# Patient Record
Sex: Female | Born: 1985 | Race: White | Hispanic: No | Marital: Single | Smoking: Never smoker
Health system: Southern US, Community
[De-identification: ages and names within clinical notes are randomized; demographics above are authoritative.]

## PROBLEM LIST (undated history)

## (undated) DIAGNOSIS — N200 Calculus of kidney: Secondary | ICD-10-CM

## (undated) DIAGNOSIS — T8859XA Other complications of anesthesia, initial encounter: Secondary | ICD-10-CM

## (undated) DIAGNOSIS — F39 Unspecified mood [affective] disorder: Secondary | ICD-10-CM

## (undated) DIAGNOSIS — F419 Anxiety disorder, unspecified: Secondary | ICD-10-CM

## (undated) HISTORY — PX: LITHOTRIPSY: SUR834

## (undated) HISTORY — PX: LEEP: SHX91

## (undated) HISTORY — DX: Calculus of kidney: N20.0

## (undated) HISTORY — DX: Unspecified mood (affective) disorder: F39

## (undated) HISTORY — DX: Anxiety disorder, unspecified: F41.9

## (undated) HISTORY — DX: Other complications of anesthesia, initial encounter: T88.59XA

---

## 2012-05-08 ENCOUNTER — Ambulatory Visit: Payer: Self-pay | Admitting: Dentist

## 2012-05-08 ENCOUNTER — Encounter: Payer: Self-pay | Admitting: Dentist

## 2012-05-08 VITALS — BP 121/64 | HR 67 | Temp 97.3°F | Ht 70.0 in | Wt 190.0 lb

## 2012-05-08 DIAGNOSIS — Z7689 Persons encountering health services in other specified circumstances: Secondary | ICD-10-CM

## 2012-05-09 ENCOUNTER — Encounter: Payer: Self-pay | Admitting: Dentist

## 2012-05-09 NOTE — Progress Notes (Signed)
Consult Requested by: Drs. Colletta [place], Urgent Care  Chief Complaint   Patient presents with   . Dental Problem       History of present Illness: asymptomatic at this time    Past Medical Hx:   Past Medical History   Diagnosis Date   . Kidney stones      Past Surgical Hx:   Past Surgical History   Procedure Laterality Date   . Lithotripsy     . Leep         Complications with General anesthesia/surgery:  None    Meds:   Prior to Admission medications    Medication Sig Start Date End Date Taking? Authorizing Provider   Norgestim-Eth Estrad Triphasic (ORTHO TRI-CYCLEN, 28,) 0.18/0.215/0.25 MG-35 MCG TABS Take 1 tablet by mouth daily   Yes [provider]   OCELLA 3-0.03 MG per tablet Take  by mouth. 04/30/07   Provider, Conversion   ciprofloxacin (CIPRO) 500 MG tablet take 1 tablet by mouth twice a day for 3 days 11/24/06   Provider, Conversion   HYDROcodone-acetaminophen (VICODIN) 5-500 MG per tablet take 1 tablet by mouth every 6 hours if needed 07/26/07   Provider, Conversion   BICARSIM 300-60 MG TABS take 1 capsule by mouth twice a day for 15 DAYS 07/14/07   Provider, Conversion   nicotine (NICODERM CQ) 7 MG/24HR apply 1 patch once daily for 7 days 07/14/07   Provider, Conversion   VALTREX 500 MG tablet take 2 tablets by mouth three times a day for 7 days 07/14/07   Provider, Conversion   DENAVIR 1 % cream apply topically twice a day as directed 07/04/07   Provider, Conversion   EFFEXOR XR 75 MG 24 hr capsule take 1 capsule by mouth once daily 06/20/07   Provider, Conversion      (home meds)  Current Outpatient Prescriptions   Medication   . Norgestim-Eth Estrad Triphasic (ORTHO TRI-CYCLEN, 28,) 0.18/0.215/0.25 MG-35 MCG TABS   . OCELLA 3-0.03 MG per tablet   . ciprofloxacin (CIPRO) 500 MG tablet   . HYDROcodone-acetaminophen (VICODIN) 5-500 MG per tablet   . BICARSIM 300-60 MG TABS   . nicotine (NICODERM CQ) 7 MG/24HR   . VALTREX 500 MG tablet   . DENAVIR 1 % cream   . EFFEXOR XR 75 MG 24 hr capsule     No  current facility-administered medications for this visit.      (current meds)  Allergies:  Review of patient's allergies indicates no known allergies (drug, envir, food or latex).  Social:  reports that she has been smoking Cigarettes.  She has been smoking about 0.25 packs per day. She does not have any smokeless tobacco history on file. She reports that  drinks alcohol. She reports that she does not use illicit drugs.   Family [id number] reviewed. No pertinent family history.    Family h/o complications with General Anesthesia:   None    VS: Blood pressure 121/64, pulse 67, temperature 36.3 C (97.3 F), temperature source Temporal, height 1.778 m (5\' 10" ), weight 86.183 kg (190 lb), last menstrual period 04/30/2012, SpO2 100.00%.    Extraoral exam:    No Cervical LAD   TMJ: No Crepitus    Intraoral exam:   Maximum incisal opening:  40 mm   Mallampati:  Class 2   No mucosal lesions   Third molars:  17 and 32 present:     Radiographs:  Pan in [place]   Recurrent decay: 14, 15, 18,  31   Retained roots: teeth 17, 30 (already was extracted), 32    Assessment: 27 y.o.  female with dental anxiety referred for extraction of teeth 14, 15, 17, 18, 19 (please re-evaluate for extraction), 31, 32 under sedation.      Plan: extraction of teeth 14, 15, 17, 18, 19 (please re-evaluate for extraction), 31, 32 with IV sedation    Schedule for 90 min    Reviewed with Dr. Lurlean Leyden ([facility] Resident)

## 2012-05-30 ENCOUNTER — Ambulatory Visit: Payer: Self-pay | Admitting: Dentist

## 2012-05-30 ENCOUNTER — Encounter: Payer: Self-pay | Admitting: Dentist

## 2012-05-30 VITALS — BP 127/87 | HR 82 | Resp 18 | Ht 69.0 in | Wt 190.0 lb

## 2012-05-30 DIAGNOSIS — K029 Dental caries, unspecified: Secondary | ICD-10-CM

## 2012-05-30 MED ORDER — SODIUM CHLORIDE 0.9 % IV SOLN WRAPPED *I*
125.0000 mL/h | Status: AC
Start: 2012-05-30 — End: 2012-07-29
  Administered 2012-05-30: 125 mL/h via INTRAVENOUS

## 2012-05-30 MED ORDER — IBUPROFEN 600 MG PO TABS *I*
600.0000 mg | ORAL_TABLET | Freq: Four times a day (QID) | ORAL | Status: AC | PRN
Start: 2012-05-30 — End: 2012-06-29

## 2012-05-30 MED ORDER — FENTANYL CITRATE 50 MCG/ML IJ SOLN *WRAPPED*
INTRAMUSCULAR | Status: DC
Start: 2012-05-30 — End: 2012-05-30
  Filled 2012-05-30: qty 2

## 2012-05-30 MED ORDER — MIDAZOLAM HCL 5 MG/5ML IJ SOLN *I*
INTRAMUSCULAR | Status: DC
Start: 2012-05-30 — End: 2012-05-30
  Filled 2012-05-30: qty 10

## 2012-05-30 MED ORDER — CHLORHEXIDINE GLUCONATE 0.12 % MT SOLN *I*
15.0000 mL | Freq: Two times a day (BID) | OROMUCOSAL | Status: DC
Start: 2012-05-30 — End: 2015-01-01

## 2012-05-30 MED ORDER — HYDROCODONE-ACETAMINOPHEN 5-325 MG PO TABS *I*
1.0000 | ORAL_TABLET | ORAL | Status: DC | PRN
Start: 2012-05-30 — End: 2015-01-01

## 2012-05-30 NOTE — Patient Instructions (Signed)
ORAL & MAXILLOFACIAL SURGERY    POSTOPERATIVE INSTRUCTIONS FOLLOWING DENTAL SURGERY AND SEDATION    After receiving sedative medications you may be drowsy for up to 24hrs.  DO NOT drive or operate heavy machinery for 24hrs, DO NOT drink alcoholic beverages for 24hrs, DO NOT make major decisions, sign contracts, etc. for 24hrs.    DO NOT drive, operate heavy machinery, drink alcoholic beverages or make major decisions or sign important documents while taking narcotic pain medications.     First 30 minutes after procedure:  Bite on gauze for 30 minutes after your procedure, to help stop bleeding.  It is best that you do this without checking the site until the 30 minutes is up. Do not chew on the gauze, as continued pressure is desired.  During this time, do not speak or do other activities that could dislodge the gauze packs.  If bleeding continues after 30 minutes, bite on fresh gauze as follows:  dampen the gauze first so that it is moist (not wet) and fold it twice.  Place the dampened gauze on the extraction sites for another 30 minutes.  Repeat as necessary.  A small amount of continued blood, which tinges the saliva, is expected during the first 24 hours and does not require further pressure.  If bleeding restarts after it has stopped, use gauze pressure again or consider gently biting on a dry tea bag placed over the extraction site for 30 minutes.  If the bleeding does not stop despite repeated attempts as above, please call [phone].    Avoid excessive exertion for 72 hours after your procedure.  No bending, lifting, strenuous activity,sports or gym-class.  After this time, resume normal activity gradually as you become comfortable.    Mouth opening may be limited during the first week to ten days after surgery.  This is expected due to swelling and tightness of the jaw muscles.   Bruising (black and blue areas) may develop after surgery.  Should this occur, expect bruising to resolve after the first week to  ten days.   5.   Earache or TMJ (jaw joint) tenderness may develop that will gradually resolve over the first week.          6.   NO ORAL CARE during the first 24 hours.  Beginning on the day after surgery, rinse your mouth after meals (or    4-5 times per day) with warm salt water (1/2 teaspoon of salt in an 8oz. glass of warm water).  A prescription mouth rinse may be given in selected cases. Begin tooth brushing on the day after surgery, as you normally  would however this should be performed gently in the area of extractions.         7.  DO NOT USE STRAWS for the first week after surgery.  Straws create suction pressure inside your mouth and could dislodge the blood clots that normally form in the extraction sites.          8.  DO NOT SMOKE during the first 21 days after surgery.  Smoking greatly increases the risk of  and other healing complications including infection, pain, and dry socket.          9.  SWELLING is normal and will maximize 48 - 72 hours after surgery.  Apply ice in a damp towel against your cheeks for the first 24-48 hours, as follows: 20 minutes on 40 minutes off, then repeat. Keep your head elevated with 2 or 3   pillows when lying down during the first 24 hours.  This will reduce bleeding, swelling and improve your level of comfort.  May apply VaselineR or ChapstickR to the lips to keep them moist.        10.  Mild to moderate pain may be encountered after the local anesthetic wears off. If not contraindicated by your health history, take two Extra-Strength TylenolR (500mg ea.) or two MotrinR/AdvilR/Ibuprofen (200mg ea.).  Avoid aspirin-containing products since these may lead to more bleeding.  For moderate to severe pain, take the  prescription medication as prescribed for you.  Do not operate heavy machinery or make important decisions while on narcotics.        11.  In case of uncontrolled bleeding, extreme pain or unusual circumstances, please call the office immediately.          POSTOPERATIVE DIET SUGGESTIONS           1.  Avoid eating or drinking anything within the first hour after surgery.  After this time, drink lots of liquids  and eat soft, cool foods.  Avoid excessively hot liquids and foods for the first 24 hours.           2.  During the first week, avoid hard, crispy foods such as chips, hard pretzels, and popcorn.          3.  The following food choices are recommended (“liquid foods on the day of the procedure, soft “mushy” foods during the first week advancing to regular food as tolerated”):     a. During the first few days, blenderized foods, soup broth, well-cooked pasta, eggs, yogurt, cottage cheese, Jell-O, pudding, custard, applesauce, bananas and ice cream.     b. After the first few days, gradually increase your diet to include: fish, chicken, meatloaf, mashed potatoes, cooked vegetables, soups and cereals, etc…     SINUS PRECAUTIONS (if indicated)    Keep your head elevated during sleep with two to three pillows.  Do NOT blow your nose   If you sneeze do not sneeze through the nose and do not “hold back.”  You may sneeze with your mouth open.    Expect slight nose bleeds.  Call the office immediately for heavy bleeding from the nose that you are unable to control with pressure.   Do NOT do anything that requires a sucking-in or blowing-out motion such as smoking, use of straws or blowing up balloons.   Take all prescribed and/or recommended medications as directed.

## 2012-05-30 NOTE — Preop H&P (Signed)
AMBULATORY  Chief Complaint:   Chief Complaint   Patient presents with   . Procedure     Ext 14, 15, 17, 18, 19, 31, 32 with IV Sedation       History of Present Illness:  HPI Referred for Extraction of Carious teeth.  Patient presented to [facility] and referred to [facility] for extractions.    Past Medical History   Diagnosis Date   . Kidney stones    . Complication of anesthesia      n/v after anesthetics per pt     Past Surgical History   Procedure Laterality Date   . Lithotripsy     . Leep       History reviewed. No pertinent family history.  History     Social History   . Marital Status: Single     Spouse Name: N/A     Number of Children: N/A   . Years of Education: N/A     Social History Main Topics   . Smoking status: Current Every Day Smoker -- 0.25 packs/day for 7 years     Types: Cigarettes   . Smokeless tobacco: None   . Alcohol Use: 0.0 oz/week     0 Cans of beer per week      Comment: occas 1x/ wk   . Drug Use: No   . Sexually Active: None     Other Topics Concern   . None     Social History Narrative   . None       Allergies:   No Known Allergies (drug, envir, food or latex)    Medications:  Current Outpatient Prescriptions   Medication   . SPRINTEC 28 0.25-35 MG-MCG per tablet   . ibuprofen (ADVIL,MOTRIN) 600 MG tablet   . HYDROcodone-acetaminophen (NORCO) 5-325 MG per tablet   . chlorhexidine (PERIDEX) 0.12 % solution   . Norgestim-Eth Estrad Triphasic (ORTHO TRI-CYCLEN, 28,) 0.18/0.215/0.25 MG-35 MCG TABS   . OCELLA 3-0.03 MG per tablet   . ciprofloxacin (CIPRO) 500 MG tablet   . BICARSIM 300-60 MG TABS   . nicotine (NICODERM CQ) 7 MG/24HR   . VALTREX 500 MG tablet   . DENAVIR 1 % cream   . EFFEXOR XR 75 MG 24 hr capsule     Current Facility-Administered Medications   Medication Dose Route Frequency   . fentaNYL (SUBLIMAZE) 0.05 MG/ML injection       . midazolam (VERSED) 1 mg/mL injection            Review of Systems:   Review of Systems   Constitutional: Negative.  Negative for fever and chills.    HENT: Negative.    Eyes: Negative.    Respiratory: Negative.  Negative for cough.    Cardiovascular: Negative.  Negative for chest pain.   Gastrointestinal: Negative.  Negative for heartburn, nausea and vomiting.   Genitourinary: Negative.    Musculoskeletal: Negative.    Skin: Negative.    Neurological: Negative.    Endo/Heme/Allergies: Negative.    Psychiatric/Behavioral: Positive for depression. The patient is nervous/anxious.        Blood pressure 129/81, pulse 84, resp. rate 16, height 1.753 m (5\' 9" ), weight 86.183 kg (190 lb), last menstrual period 04/30/2012, SpO2 97.00%.    Physical Exam   Constitutional: She is oriented to person, place, and time. Vital signs are normal. She appears well-developed and well-nourished.   HENT:   Head: Normocephalic and atraumatic. No trismus in the jaw.   Right  Ear: Hearing and external ear normal.   Left Ear: Hearing and external ear normal.   Nose: Nose normal.   Mouth/Throat: Uvula is midline, oropharynx is clear and moist and mucous membranes are normal. Abnormal dentition. Dental caries present. No uvula swelling.   Eyes: Conjunctivae and EOM are normal. Pupils are equal, round, and reactive to light.   Neck: Normal range of motion. Neck supple.   Cardiovascular: Normal rate and regular rhythm.    Pulmonary/Chest: Effort normal and breath sounds normal.   Abdominal: Soft. Bowel sounds are normal.   Musculoskeletal: Normal range of motion.   Neurological: She is alert and oriented to person, place, and time. No cranial nerve deficit.   Skin: Skin is warm and dry.   Psychiatric: She has a normal mood and affect. Her behavior is normal.         Assessment: 27 year old female with history of kidney stones and smoking, otherwise healthy presenting for extraction of carious teeth. MP1.    Plan: Extract carious 14, 15, 17, 18, 19, 31, 32 with IV Sedation.       Author: Gerlene Burdock, DDS  Note created: 05/30/2012  at: 11:24 AM

## 2012-05-30 NOTE — Procedures (Signed)
Date: 05/30/2012  Patient Name: Amy Osborn       DOB:19-Aug-1985              Preoperative Diagnosis: Dental Caries         Planned Procedure: Ext 14, 15, 17, 18, 19, 31, 32 with IV Sedation    Anesthesia:    I reviewed technical details, risks, benefits, and alternative treatment and anesthetic options with the patient (and family).  An opportunity was given for questions.  All questions were answered to the apparent satisfaction of the patient and family.  All wish to proceed.    Health History:reviewed and in chart, without changes since consultation visit    ASA  Class : II    Exam:  Malampati: I  Anesthesia as per record.      Local Anesthetic was administered via blocks and infiltrations.         # of carpules  2% Lidocaine with 1:100000 Epi 6  3% Mepivicaine    0  4% Septocaine with 1:100000 Epi 0  0.50% Marcaine   0  0.25% Marcaine   0    Procedure:   Timeout taken with surgical team and patient describing appropriate procedure. IV sedation as per record.  FTMP envelope flap made using an sulcular incision made from DB #13 to DB #15 with 15 blade. Troughed buccal bone. Elevate and and Extract #14 and 15.  FTMP envelope flap made using an sulcular incision made from DB #20 to DB #17 with 15 blade. Fissure bur used to trough buccal bone. Elevated and extracted 17, 18, 19 with 77R and delivered with forceps.   FTMP envelope flap made using an sulcular incision made from MB #31 to DB #32 with 15 blade. Fissure bur used to trough buccal bone.  Elevated and Extracted 31, 32 with 77R and delivered with forceps.  Sockets curetted.  Rinsed copiously with saline. Running 3-0 chromic gut sutures placed.  Gauze placed after adequate hemostasis.  POIG verbally and in writing.       Throat screen: yes  Bite Block / Jaw stabilized for TMJ prophylaxis: yes     Throat screen removed: yes   Oral gauze packs placed for hemostasis: yes    Monitored in Recovery until alert and vital signs stable then discharged ZO:XWRU    The  wounds were hemostatic at discharge.    Complications:  None    Rx Medications: Norco, Motrin and Peridex     Postoperative Instructions given and explained to patient and escort  Follow up appointment arranged for: prn

## 2013-05-11 ENCOUNTER — Emergency Department (HOSPITAL_COMMUNITY)
Admission: EM | Admit: 2013-05-11 | Discharge: 2013-05-11 | Disposition: A | Discharge status: Home / Self Care | Payer: Self-pay | Attending: Emergency Medicine | Admitting: Emergency Medicine

## 2013-05-11 ENCOUNTER — Emergency Department (HOSPITAL_COMMUNITY): Payer: Self-pay

## 2013-05-11 ENCOUNTER — Encounter (HOSPITAL_COMMUNITY): Payer: Self-pay | Admitting: Emergency Medicine

## 2013-05-11 DIAGNOSIS — S8392XA Sprain of unspecified site of left knee, initial encounter: Secondary | ICD-10-CM

## 2013-05-11 DIAGNOSIS — W19XXXA Unspecified fall, initial encounter: Secondary | ICD-10-CM

## 2013-05-11 DIAGNOSIS — S93409A Sprain of unspecified ligament of unspecified ankle, initial encounter: Secondary | ICD-10-CM | POA: Insufficient documentation

## 2013-05-11 DIAGNOSIS — S93401A Sprain of unspecified ligament of right ankle, initial encounter: Secondary | ICD-10-CM

## 2013-05-11 DIAGNOSIS — IMO0002 Reserved for concepts with insufficient information to code with codable children: Secondary | ICD-10-CM | POA: Insufficient documentation

## 2013-05-11 DIAGNOSIS — Y929 Unspecified place or not applicable: Secondary | ICD-10-CM | POA: Insufficient documentation

## 2013-05-11 DIAGNOSIS — R296 Repeated falls: Secondary | ICD-10-CM | POA: Insufficient documentation

## 2013-05-11 DIAGNOSIS — Y9301 Activity, walking, marching and hiking: Secondary | ICD-10-CM | POA: Insufficient documentation

## 2013-05-11 MED ORDER — NAPROXEN 500 MG PO TABS: 500.0000 mg | ORAL_TABLET | Freq: Two times a day (BID) | ORAL | Status: AC

## 2013-05-11 NOTE — Discharge Instructions (Signed)
X-rays showed no broken bones or dislocations Please keep knee sleeve on an ankle brace on at all times when active-can remove when relaxing and showering Please use crutches for comfort Please use medications as prescribed and on a full stomach Please rest, ice and elevate-elevate above heart level Please avoid any physical or strenuous activity Please continue to monitor symptoms closely and if symptoms are to worsen or change (fever greater than 101, fall, injury, numbness, tingling, worsening pain, redness, red streaks, warmth to touch of the joints, chest pain, shortness of breath, difficulty breathing) please report back to emergency department immediately   Ankle Sprain An ankle sprain is an injury to the strong, fibrous tissues (ligaments) that hold the bones of your ankle joint together.  CAUSES An ankle sprain is usually caused by a fall or by twisting your ankle. Ankle sprains most commonly occur when you step on the outer edge of your foot, and your ankle turns inward. People who participate in sports are more prone to these types of injuries.  SYMPTOMS   Pain in your ankle. The pain may be present at rest or only when you are trying to stand or walk.  Swelling.  Bruising. Bruising may develop immediately or within 1 to 2 days after your injury.  Difficulty standing or walking, particularly when turning corners or changing directions. DIAGNOSIS  Your caregiver will ask you details about your injury and perform a physical exam of your ankle to determine if you have an ankle sprain. During the physical exam, your caregiver will press on and apply pressure to specific areas of your foot and ankle. Your caregiver will try to move your ankle in certain ways. An X-ray exam may be done to be sure a bone was not broken or a ligament did not separate from one of the bones in your ankle (avulsion fracture).  TREATMENT  Certain types of braces can help stabilize your ankle. Your caregiver  can make a recommendation for this. Your caregiver may recommend the use of medicine for pain. If your sprain is severe, your caregiver may refer you to a surgeon who helps to restore function to parts of your skeletal system (orthopedist) or a physical therapist. HOME CARE INSTRUCTIONS   Apply ice to your injury for 1 2 days or as directed by your caregiver. Applying ice helps to reduce inflammation and pain.  Put ice in a plastic bag.  Place a towel between your skin and the bag.  Leave the ice on for 15-20 minutes at a time, every 2 hours while you are awake.  Only take over-the-counter or prescription medicines for pain, discomfort, or fever as directed by your caregiver.  Elevate your injured ankle above the level of your heart as much as possible for 2 3 days.  If your caregiver recommends crutches, use them as instructed. Gradually put weight on the affected ankle. Continue to use crutches or a cane until you can walk without feeling pain in your ankle.  If you have a plaster splint, wear the splint as directed by your caregiver. Do not rest it on anything harder than a pillow for the first 24 hours. Do not put weight on it. Do not get it wet. You may take it off to take a shower or bath.  You may have been given an elastic bandage to wear around your ankle to provide support. If the elastic bandage is too tight (you have numbness or tingling in your foot or your foot becomes  cold and blue), adjust the bandage to make it comfortable.  If you have an air splint, you may blow more air into it or let air out to make it more comfortable. You may take your splint off at night and before taking a shower or bath. Wiggle your toes in the splint several times per day to decrease swelling. SEEK MEDICAL CARE IF:   You have rapidly increasing bruising or swelling.  Your toes feel extremely cold or you lose feeling in your foot.  Your pain is not relieved with medicine. SEEK IMMEDIATE MEDICAL  CARE IF:  Your toes are numb or blue.  You have severe pain that is increasing. MAKE SURE YOU:   Understand these instructions.  Will watch your condition.  Will get help right away if you are not doing well or get worse. Document Released: 03/14/2005 Document Revised: 12/07/2011 Document Reviewed: 03/26/2011 [facility] Patient Information 2014 [place], [place].   Emergency Department Resource Guide 1) Find a Doctor and Pay Out of Pocket Although you won't have to find out who is covered by your insurance plan, it is a good idea to ask around and get recommendations. You will then need to call the office and see if the doctor you have chosen will accept you as a new patient and what types of options they offer for patients who are self-pay. Some doctors offer discounts or will set up payment plans for their patients who do not have insurance, but you will need to ask so you aren't surprised when you get to your appointment.  2) Contact Your Local Health Department Not all health departments have doctors that can see patients for sick visits, but many do, so it is worth a call to see if yours does. If you don't know where your local health department is, you can check in your phone book. The CDC also has a tool to help you locate your state's health department, and many state websites also have listings of all of their local health departments.  3) Find a Walk-in Clinic If your illness is not likely to be very severe or complicated, you may want to try a walk in clinic. These are popping up all over the country in pharmacies, drugstores, and shopping centers. They're usually staffed by nurse practitioners or physician assistants that have been trained to treat common illnesses and complaints. They're usually fairly quick and inexpensive. However, if you have serious medical issues or chronic medical problems, these are probably not your best option.  No Primary Care Doctor: - Call Health  Connect at  [phone] - they can help you locate a primary care doctor that  accepts your insurance, provides certain services, etc. - Physician Referral Service- [phone]  Chronic Pain Problems: Organization         Address  Phone   Notes  [facility]  [phone] Patients need to be referred by their primary care doctor.   Medication Assistance: Organization         Address  Phone   Notes  [facility] Med[facility] [address] [place] [place], [place] [address] [phone] --Must be a resident of [facility] -- Must have NO insurance coverage whatsoever (no Medicaid/ Medicare, etc.) -- The pt. MUST have a primary care doctor that directs their care regularly and follows them in the community   [facility]ist  [phone]   [employer]  [phone]    Agencies that provide inexpensive  medical care: Organization         Address  Phone   Notes  [facility]  [phone]   [facility]    [phone]   [facility] [address] [place], [place] [address] [phone]   [facility] [address], [place] [phone]   Planned Parenthood    [phone]   [facility]    [phone]   [facility]  [address], [place] Phone:  [phone], Fax:  [phone] Hours of Operation:  9 am - 6 pm, M-F.  Also accepts Medicaid/Medicare and self-pay.  [facility]  [address], [place] Phone: [phone], Fax: [phone]. Hours of Operation:  8:30 am - 5:30 pm, M-F.  Also accepts Medicaid and self-pay.  [facility] [place] [address], [place] Phone: [phone]   [facility] [address] Natasha Bence [place], [place] [phone], Ext. [phone] Mondays & Thursdays: 7-9 AM.  First 15 patients are seen on a first come, first serve basis.     Medicaid-accepting [facility] Providers:  Organization         Address  Phone   Notes  [facility] [address], [place] [phone] Also accepts self-pay patients.  [facility] [address] Laurell Josephs [place], [place]  [phone]   [facility] [address], [place] [phone]   [facility] [address] [place] [phone]   Renaye Rakers [address], [place]   [phone] Only accepts [place] patients after they have their name applied to their card.   Self-Pay (no insurance) in [facility]:  Organization         Address  Phone   Notes  Sickle Cell Patients, [facility] Internal Medicine [address] [place], [place] [phone]   [facility] [address] [place], [place] [phone]   [facility] [place]  [address], [place] [phone]   [facility]/Dr. Osei-Bonsu  [address] [place] or [address], [place] [phone] Phone number for both [place] and [place] locations is the same.  [facility] and [facility] [address], [place] [phone]   [facility] [address] [place] or [address] [phone]   [facility] [address], [place] [phone], phone; [phone], fax Sees patients 1st and 3rd Saturday of every month.  Must not qualify for public or private insurance (i.e. Medicaid, Medicare, [place] Health Choice, Veterans' Benefits)  Household income should be no more than 200% of the poverty level The clinic cannot treat you if you are pregnant or think you are pregnant  Sexually transmitted diseases are not treated at the clinic.    Dental Care: Organization         Address  Phone  Notes  [facility]  [facility] of [facility] [address] [place], [place] [phone] Accepts children up to age 62 who are enrolled in [place] or [place] Health Choice; pregnant women with a Medicaid card; and children who have applied for Medicaid or [place] Health Choice, but  were declined, whose parents can pay a reduced fee at time of service.  [facility] of [facility]  [address], [place] [phone] Accepts children up to age 5 who are enrolled in [place] or [place] Health Choice; pregnant women with a Medicaid card; and children who have applied for Medicaid or [place] Health Choice, but were declined, whose parents can pay a reduced fee at time of service.  [facility]  [address] [place], [place] [phone] Patients are seen by appointment only. Walk-ins are not accepted. [facility] will see patients 58 years of age and older. Monday - Tuesday (8am-5pm) Most Wednesdays (8:30-5pm) $30 per visit, cash only  [facility]  [address], [facility] [phone] Patients are seen by appointment only. Walk-ins are not accepted. [facility] will see patients 54 years of age and older. One Wednesday Evening (Monthly: Volunteer Based).  $30 per visit, cash only  [employer]  [phone] for adults; Children under age 44, call Graduate Pediatric Dentistry at [phone]. Children aged 19-14, please call [phone] to request a pediatric application.  Dental services are provided in all areas of dental care including fillings, crowns and bridges, complete and partial dentures, implants, gum treatment, root canals, and extractions. Preventive care is also provided. Treatment is provided to both adults and children. Patients are selected via a lottery and there is often a waiting list.   [facility] [address], [place]  [phone]  [email or web address]   [facility] [address] [place], [place] [phone], Ext. [phone] Second and Fourth Thursday of each month, opens at 6:30 AM; Clinic ends at 9 AM.  Patients are seen on a first-come first-served basis, and a limited number are seen during each clinic.   [facility]  [address] Ether Griffins [place], [place] [phone]   Eligibility Requirements You must have lived in [place], [place], or [place] counties for at least the last three months.   You cannot be eligible for state or federal sponsored [place], including [employer], [place], or [employer].   You generally cannot be eligible for healthcare insurance through your employer.    How to apply: Eligibility screenings are held every Tuesday and Wednesday afternoon from 1:00 pm until 4:00 pm. You do not need an appointment for the interview!  [facility] [address], [place], [place] [phone]   [facility] Health Department  [phone]   [facility] Health Department  [phone]   [facility] Health Department  [phone]    Behavioral Health Resources in the Community: Intensive Outpatient Programs Organization         Address  Phone  Notes  [facility] [address], [place], [place] [phone]   [facility] Outpatient [address] [place], [place] [phone]   [facility] [address] [place], [place]  [phone]   [facility] Mental [facility] [address]  [place], [place] [phone] or [phone]   Substance Abuse Resources Organization         Address  Phone  Notes  [facility]  [phone]   [facility]  [phone]   [facility] [place]  [phone]   Floydene Flock  [phone]   Residential & Outpatient Substance Abuse Program  [phone]   Psychological Services Organization  Address  Phone  Notes  [facility]  [phone]- [phone]   [facility]  [phone]- [phone]   [facility] [phone] [address], [place] [phone] or [phone]    Mobile Crisis Teams Organization         Address  Phone  Notes  [facility], Mobile Crisis Care Unit  [phone]   [facility]  [address] [place], [place] [phone]   Doristine LocksSharon DeEsch [address] [place] [phone]    Self-Help/Support Groups Organization         Address  Phone             Notes  [facility] [place] - variety of support groups  [phone]- [phone] Call for more information  Narcotics Anonymous (NA), [facility] [address], [place]  2 meetings at this location   Statisticianesidential Treatment Programs Organization         Address  Phone  Notes  [facility] [address],    [place]  [phone]   [facility]  [address], [place] [phone], [place], [place] [phone]   [facility] [address], [place] [phone] Admissions: 8am-3pm M-F  [facility] [address]    [place], [place] [phone]   [facility] [address], [place], [place] [phone]   [facility] [address]  [place], [place] [phone]   [facility] - Intensive Outpatient [address], [place], [place] [phone]   [facility] ([facility] [address]  [place], [place] [phone] or [phone]   [facility] ([facility]) [address] [place], [place] [phone] Accepts Medicaid  [facility] [address]  [place] [phone] Substance Abuse/Addiction Treatment   [facility] Resources Organization         Address  Phone  Notes  [facility]  [phone](888) [phone]   Angie FavaJulie Brannon, PhD [address] [place], [place]   [phone]) [phone] or [phone]) [phone]   [facility]   [address] [place], [place] [phone](336) [phone]   [facility] [address], [place], [place] [phone] Insurance/Medicaid/sponsorship through [facility]  [facility] [address]                                    [place], [place] [phone]) [phone] Therapy/tele-psych/case  [facility] [address]   [place], [place] [phone](336) [phone]    Dr. Lolly MustacheArfeen  [phone]   [facility] [place]  [employer] [facility] 1) [address], [place] 2) [address], [place] 3)  [address], [place] [phone]) [phone]) [phone]  [phone]) [phone]   [facility] Child Abuse Hotline [phone]) [phone] or [phone]) [phone] (After Hours)

## 2013-05-11 NOTE — ED Provider Notes (Signed)
CSN: [id number]     Arrival date & time 05/11/13  1004 History   First MD Initiated Contact with Patient 05/11/13 1024     Chief Complaint  Patient presents with  . Ankle Pain     (Consider location/radiation/quality/duration/timing/severity/associated sxs/prior Treatment) The history is provided by the patient. No language interpreter was used.  Kassity Prout is a 28 y/o F with no known significant PMHX presenting to the ED with right foot/ankle and left knee pain that started last night. Patient reported that she was at a concert last, drank some alcohol - reported that as she was walking down the ramp her right ankle gave out and she fell landing on her right ankle in an inverted manner. Patient reported that during this process she twisted her left knee and has been experiencing left knee pain since then to the medial aspect. Reported that she was wearing heels last night. Stated that she right ankle/foot pain is a sharp, shooting sensation. Stated that the left knee pain is worse when she stands, described as a sharp pain to the medial aspect. Reported that she took a Tylenol earlier this morning - denied ice denied ice and elevation. Denied numbness, tingling, head injury, loss of consciousness, previous injury. PCP none  History reviewed. No pertinent past medical history. Past Surgical History  Procedure Laterality Date  . Leep    . Lithotripsy     No family history on file. History  Substance Use Topics  . Smoking status: Never Smoker   . Smokeless tobacco: Not on file  . Alcohol Use: Yes     Comment: 5-6 beers/ wk   OB History   Grav Para Term Preterm Abortions TAB SAB Ect Mult Living                 Review of Systems  Musculoskeletal: Positive for arthralgias (Right foot, right ankle, left knee).  Neurological: Negative for weakness and numbness.  All other systems reviewed and are negative.      Allergies  Review of patient's allergies indicates no known  allergies.  Home Medications   Current Outpatient Rx  Name  Route  Sig  Dispense  Refill  . acetaminophen (TYLENOL) 500 MG tablet   Oral   Take 1,000 mg by mouth every 6 (six) hours as needed for mild pain.         . naproxen (NAPROSYN) 500 MG tablet   Oral   Take 1 tablet (500 mg total) by mouth 2 (two) times daily.   30 tablet   0    BP 105/69  Pulse 89  Temp(Src) 97.5 F (36.4 C) (Oral)  Resp 17  SpO2 96%  LMP 04/21/2013 Physical Exam  Nursing note and vitals reviewed. Constitutional: She is oriented to person, place, and time. She appears well-developed and well-nourished. No distress.  HENT:  Head: Normocephalic and atraumatic.  Eyes: Conjunctivae and EOM are normal. Right eye exhibits no discharge. Left eye exhibits no discharge.  Neck: Normal range of motion. Neck supple.  Cardiovascular: Normal rate, regular rhythm and normal heart sounds.  Exam reveals no friction rub.   No murmur heard. Pulses:      Radial pulses are 2+ on the right side, and 2+ on the left side.       Dorsalis pedis pulses are 2+ on the right side, and 2+ on the left side.       Posterior tibial pulses are 2+ on the right side, and 2+ on the  left side.  Pulmonary/Chest: Effort normal and breath sounds normal. No respiratory distress. She has no wheezes. She has no rales.  Musculoskeletal: She exhibits tenderness.       Legs:      Feet:  Swelling localized to the lateral aspect of the right ankle with negative findings of ecchymosis or erythema. Discomfort upon palpation to the lateral malleolus of the right ankle. Decreased range of motion to the right ankle secondary to pain-most discomfort noted with inversion and eversion of the foot. Full range of motion to the digits of the right foot. Negative pain upon palpation to the dorsal plantar aspect of the right foot. Negative calf pain. Negative pain upon palpation to the right knee. Full range of motion to the right knee-stable right knee joint.  Mild swelling of the left the medial aspect of the left knee with negative ecchymosis, erythema or inflammation noted. Discomfort upon palpation to the medial aspect of the left knee. Full flexion and extension noted. Negative valgus and various tension noted. Negative laxity. Negative crepitus. Stable left knee joint.  Neurological: She is alert and oriented to person, place, and time. No cranial nerve deficit. She exhibits normal muscle tone. Coordination normal.  Cranial nerves III-XII grossly intact Strength 5+/5+ to lower extremities bilaterally with resistance applied, equal distribution noted Strength intact to digits of the feet bilaterally-equal strength noted Sensation intact with differentiation to sharp and dull touch  Skin: Skin is warm and dry. She is not diaphoretic.  Superficial abrasions localized to the right tib-fib region, anterior aspect  Psychiatric: She has a normal mood and affect. Her behavior is normal. Thought content normal.    ED Course  Procedures (including critical care time)  Dg Ankle Complete Right  05/11/2013   CLINICAL DATA:  Post fall, now with right foot and ankle pain  EXAM: RIGHT ANKLE - COMPLETE 3+ VIEW  COMPARISON:  DG FOOT COMPLETE*R* dated 05/11/2013  FINDINGS: No fracture or dislocation. Regional soft tissues appear normal. Joint spaces appear preserved. The ankle mortise appears preserved. No definite ankle joint effusion. Small plantar calcaneal spur. No radiopaque foreign body.  IMPRESSION: No acute findings.   Electronically Signed   By: Simonne Come M.D.   On: 05/11/2013 11:32   Dg Knee Complete 4 Views Left  05/11/2013   CLINICAL DATA:  Left-sided knee pain.  EXAM: LEFT KNEE - COMPLETE 4+ VIEW  COMPARISON:  No priors.  FINDINGS: Four views of the left knee demonstrate no definite acute displaced fracture, subluxation, dislocation, joint or soft tissue abnormality.  IMPRESSION: 1. No acute radiographic abnormality of the left knee.   Electronically  Signed   By: Trudie Reed M.D.   On: 05/11/2013 11:35   Dg Foot Complete Right  05/11/2013   CLINICAL DATA:  Post fall, now with right foot and ankle pain  EXAM: RIGHT FOOT COMPLETE - 3+ VIEW  COMPARISON:  Right ankle radiographs-earlier same day  FINDINGS: No fracture or dislocation. Mild hallux valgus deformity without associated significant degenerative change. Joint spaces appear preserved. No definite erosions. Incidental note is made of a bipartite 8 sesamoid bone about the weight-bearing surface of the first metatarsal head. Small plantar calcaneal spur. No radiopaque foreign body.  IMPRESSION: 1. No acute findings. 2. Small plantar calcaneal spur.   Electronically Signed   By: Simonne Come M.D.   On: 05/11/2013 11:32   Labs Review Labs Reviewed - No data to display Imaging Review Dg Ankle Complete Right  05/11/2013   CLINICAL  DATA:  Post fall, now with right foot and ankle pain  EXAM: RIGHT ANKLE - COMPLETE 3+ VIEW  COMPARISON:  DG FOOT COMPLETE*R* dated 05/11/2013  FINDINGS: No fracture or dislocation. Regional soft tissues appear normal. Joint spaces appear preserved. The ankle mortise appears preserved. No definite ankle joint effusion. Small plantar calcaneal spur. No radiopaque foreign body.  IMPRESSION: No acute findings.   Electronically Signed   By: Simonne Come M.D.   On: 05/11/2013 11:32   Dg Knee Complete 4 Views Left  05/11/2013   CLINICAL DATA:  Left-sided knee pain.  EXAM: LEFT KNEE - COMPLETE 4+ VIEW  COMPARISON:  No priors.  FINDINGS: Four views of the left knee demonstrate no definite acute displaced fracture, subluxation, dislocation, joint or soft tissue abnormality.  IMPRESSION: 1. No acute radiographic abnormality of the left knee.   Electronically Signed   By: Trudie Reed M.D.   On: 05/11/2013 11:35   Dg Foot Complete Right  05/11/2013   CLINICAL DATA:  Post fall, now with right foot and ankle pain  EXAM: RIGHT FOOT COMPLETE - 3+ VIEW  COMPARISON:  Right ankle  radiographs-earlier same day  FINDINGS: No fracture or dislocation. Mild hallux valgus deformity without associated significant degenerative change. Joint spaces appear preserved. No definite erosions. Incidental note is made of a bipartite 8 sesamoid bone about the weight-bearing surface of the first metatarsal head. Small plantar calcaneal spur. No radiopaque foreign body.  IMPRESSION: 1. No acute findings. 2. Small plantar calcaneal spur.   Electronically Signed   By: Simonne Come M.D.   On: 05/11/2013 11:32    EKG Interpretation   None       MDM   Final diagnoses:  Right ankle sprain  Sprain of left knee  Fall   Filed Vitals:   05/11/13 1039  BP: 105/69  Pulse: 89  Temp: 97.5 F (36.4 C)  TempSrc: Oral  Resp: 17  SpO2: 96%   Patient presenting to the ED with right ankle/foot and left knee pain after falling last night when walking down the ramp, while wearing heels. Patient reported that her right ankle gave out and she landed on it in an inverted manner - reported swelling and pain described as a constant sharp shooting. Reported pain in the left knee, medial aspect when walking and standing described as a sharp pain. Reported that she was drinking alcohol last night. Denied head injury or LOC.  Alert and oriented. GCS 15. Heart rate and rhythm normal. Lungs clear to auscultation to upper and lower lobes bilaterally. Radial, DP, PT pulses 2+ bilaterally. Swelling localized to the lateral aspect of the right ankle with negative ecchymosis or deformities-discomfort upon palpation to the lateral malleolus of the right ankle. Discomfort with inversion and eversion of the right ankle. Full range of motion to the digits of the right foot negative pain upon palpation to the digits the right foot or dorsal plantar aspect of the right foot. Cap refill less than 3 seconds. Full range of motion to the right knee without difficulty or ataxia. Mild swelling localized to the medial aspect of the  left knee with discomfort upon palpation. Full flexion extension noted, negative valgus and various tension-stable left knee joint. Strength intact with equal distribution. Sensation intact. Strength intact to digits of the feet bilaterally. Left knee plain film negative for acute abnormalities. Right ankle plain film negative for acute bony abnormalities. Plain film of right foot negative for acute bony abnormalities-small plantar calcaneal spur noted.  Negative acute fractures or dislocations noted. Patient neurovascularly intact. Suspicion to be left knee sprain and right ankle sprain. Patient placed in knee sleeve an ankle brace for comfort. Crutches administered for comfort. Patient stable, afebrile. Discharge patient with anti-inflammatories. Referred patient to urgent care Center and orthopedics. Discussed with patient to rest, ice, elevate. Discussed with patient to avoid any physical or strenuous activity. Discussed with patient to closely monitor symptoms and if symptoms are to worsen or change to report back to the ED - strict return instructions given.  Patient agreed to plan of care, understood, all questions answered.     Raymon MuttonMarissa Lyfe Monger, PA-C 05/12/13 [phone]

## 2013-05-11 NOTE — ED Notes (Signed)
Pt states fell and injured lateral R ankle last night. Ankle appears swollen and bruised. Pt able to move toes, good cap refill, good pedal pulse. Pt unable to move ankle. Pt states pain shoots up into leg when she stands on it.

## 2013-05-13 NOTE — ED Provider Notes (Signed)
Medical screening examination/treatment/procedure(s) were performed by non-physician practitioner and as supervising physician I was immediately available for consultation/collaboration.      Addalee Kavanagh Y. Brooklee Michelin, MD 05/13/13 0852 

## 2014-06-29 IMAGING — CR DG ANKLE COMPLETE 3+V*R*
3 series · 3 of 3 positions shown · non-contrast
Comparison: DG FOOT COMPLETE*R* dated 05/11/2013

CLINICAL DATA: Post fall, now with right foot and ankle pain

EXAM:
RIGHT ANKLE - COMPLETE 3+ VIEW

[x ankle ap right]
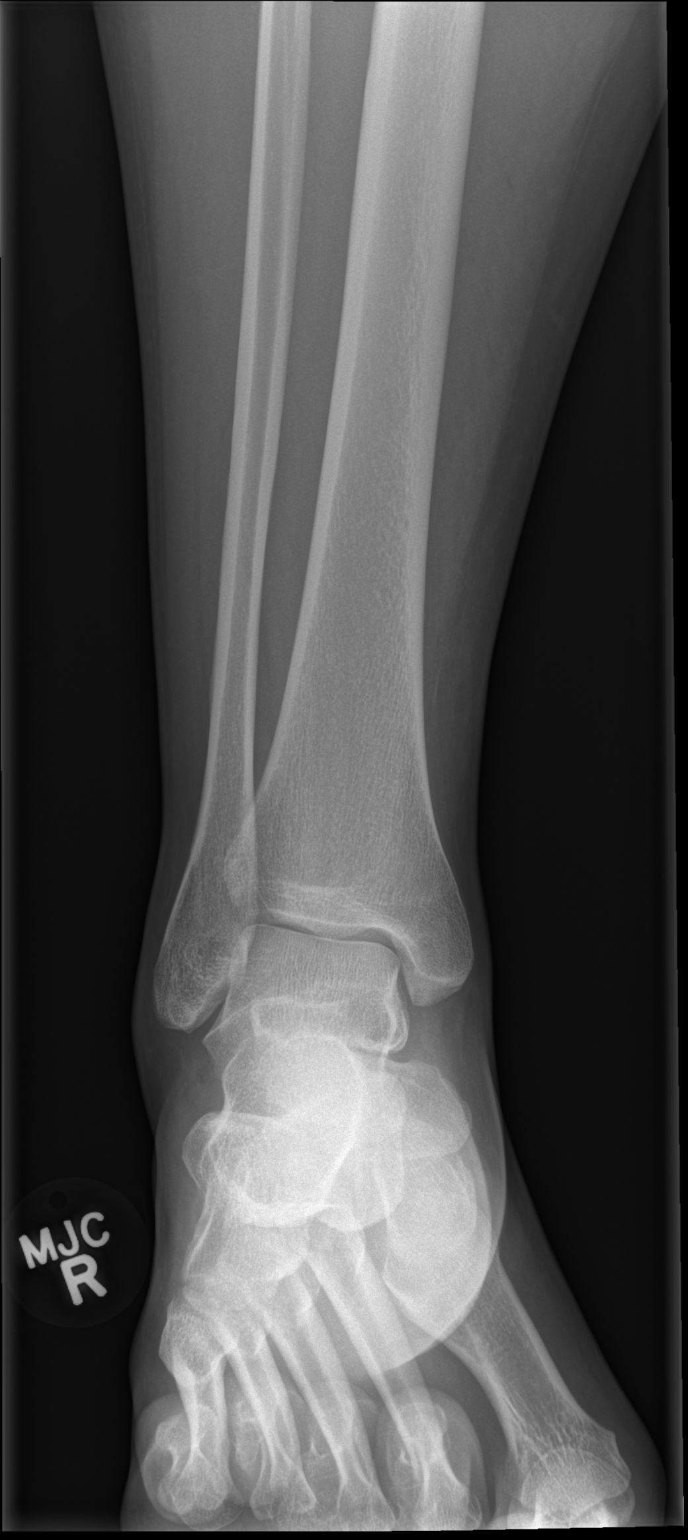

[x ankle obl right]
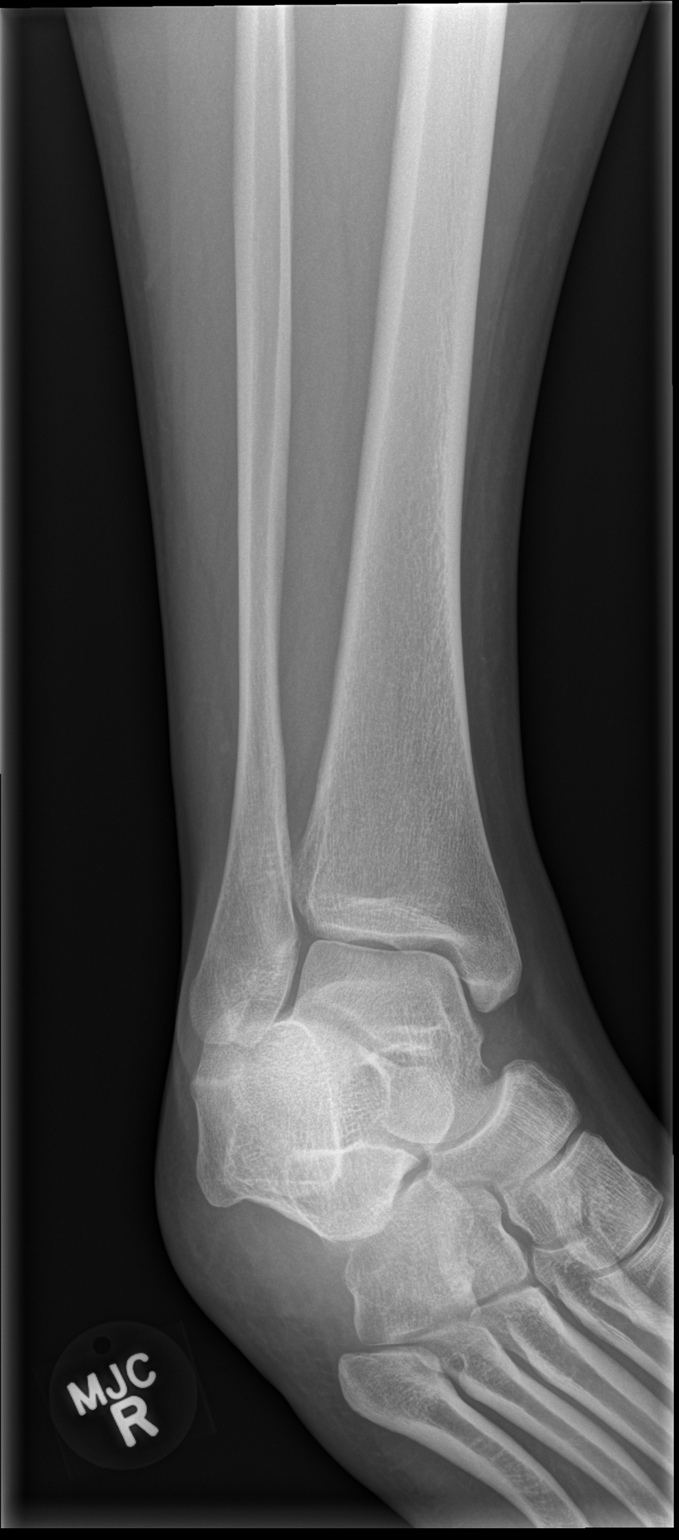

[x ankle lat right]
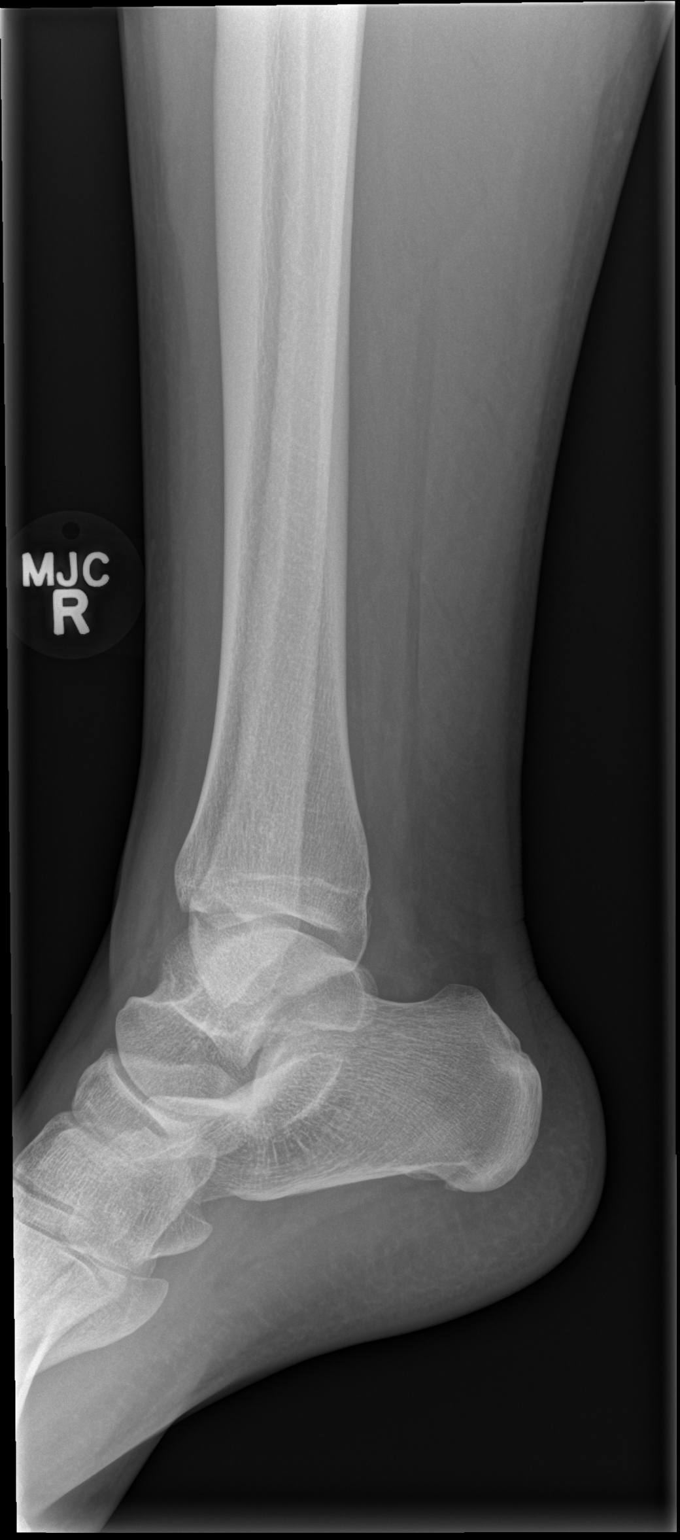

[3 of 3 positions shown; findings below may reference images not displayed]

FINDINGS: No fracture or dislocation. Regional soft tissues appear normal.
Joint spaces appear preserved. The ankle mortise appears preserved.
No definite ankle joint effusion. Small plantar calcaneal spur. No
radiopaque foreign body.
IMPRESSION: No acute findings.

## 2014-06-29 IMAGING — CR DG KNEE COMPLETE 4+V*L*
4 series · 4 of 4 positions shown · non-contrast
Comparison: No priors.

CLINICAL DATA: Left-sided knee pain.

EXAM:
LEFT KNEE - COMPLETE 4+ VIEW

[t knee ap left]
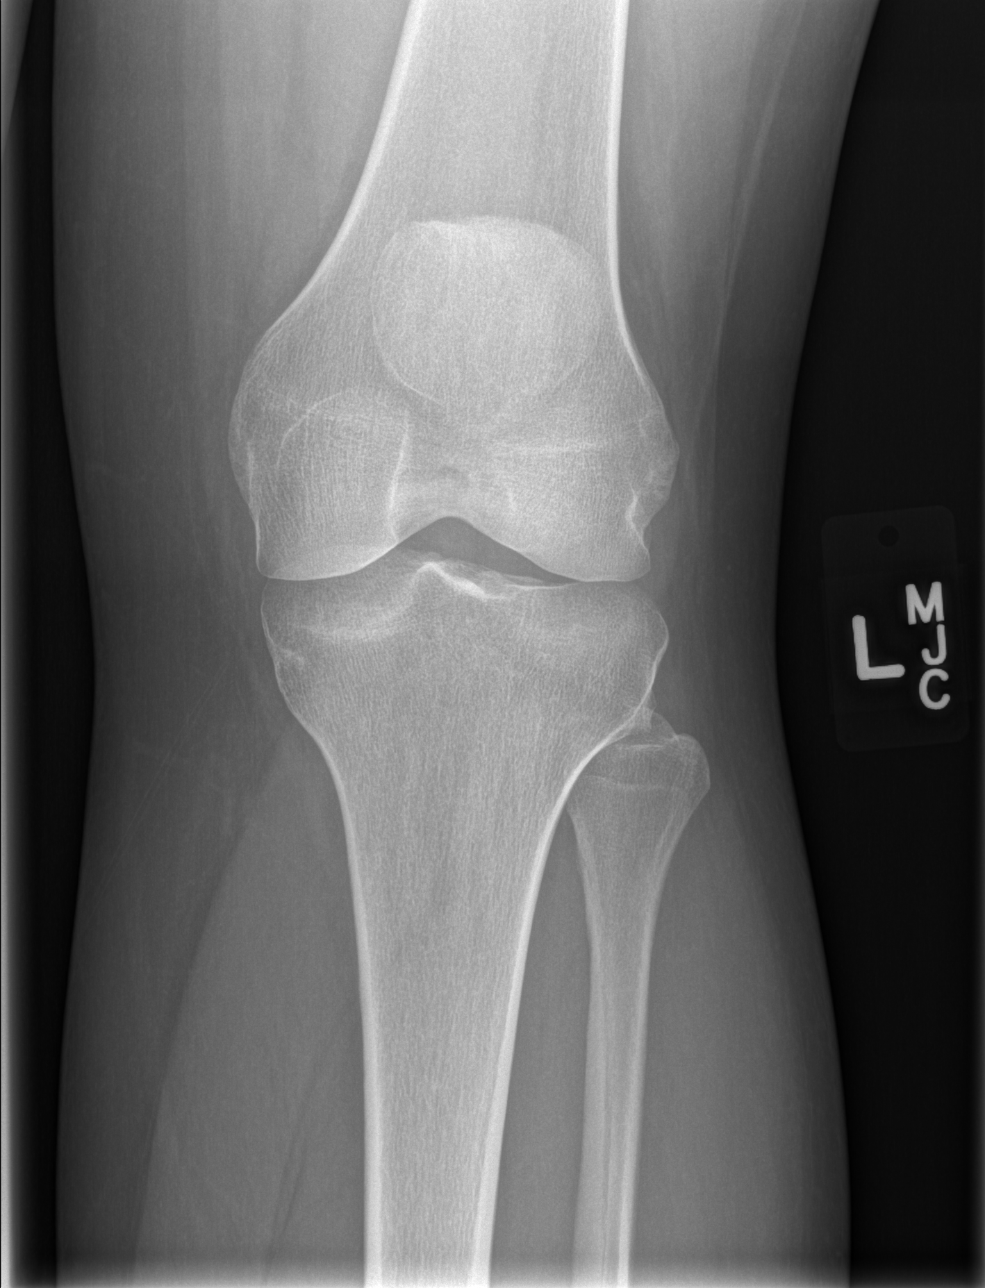

[t knee obl left (1 of 2)]
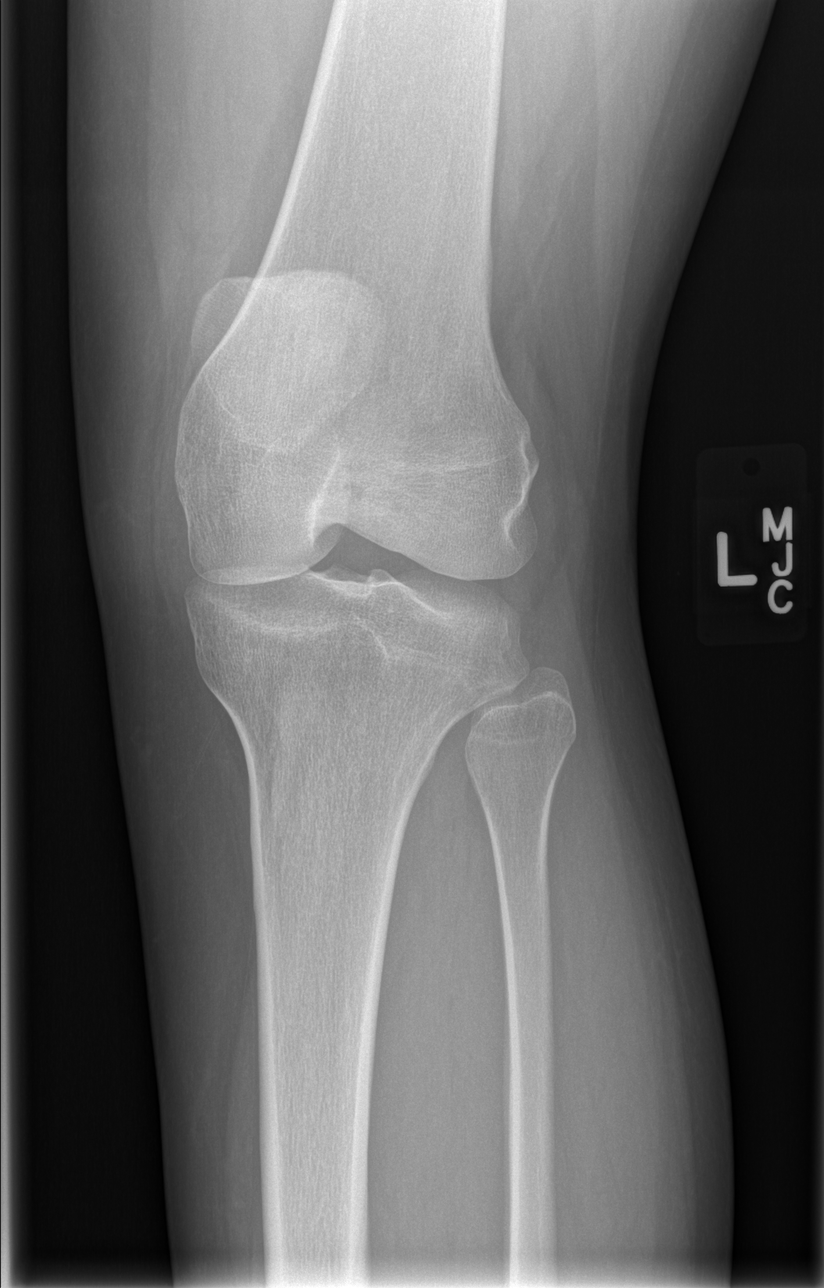

[t knee obl left (2 of 2)]
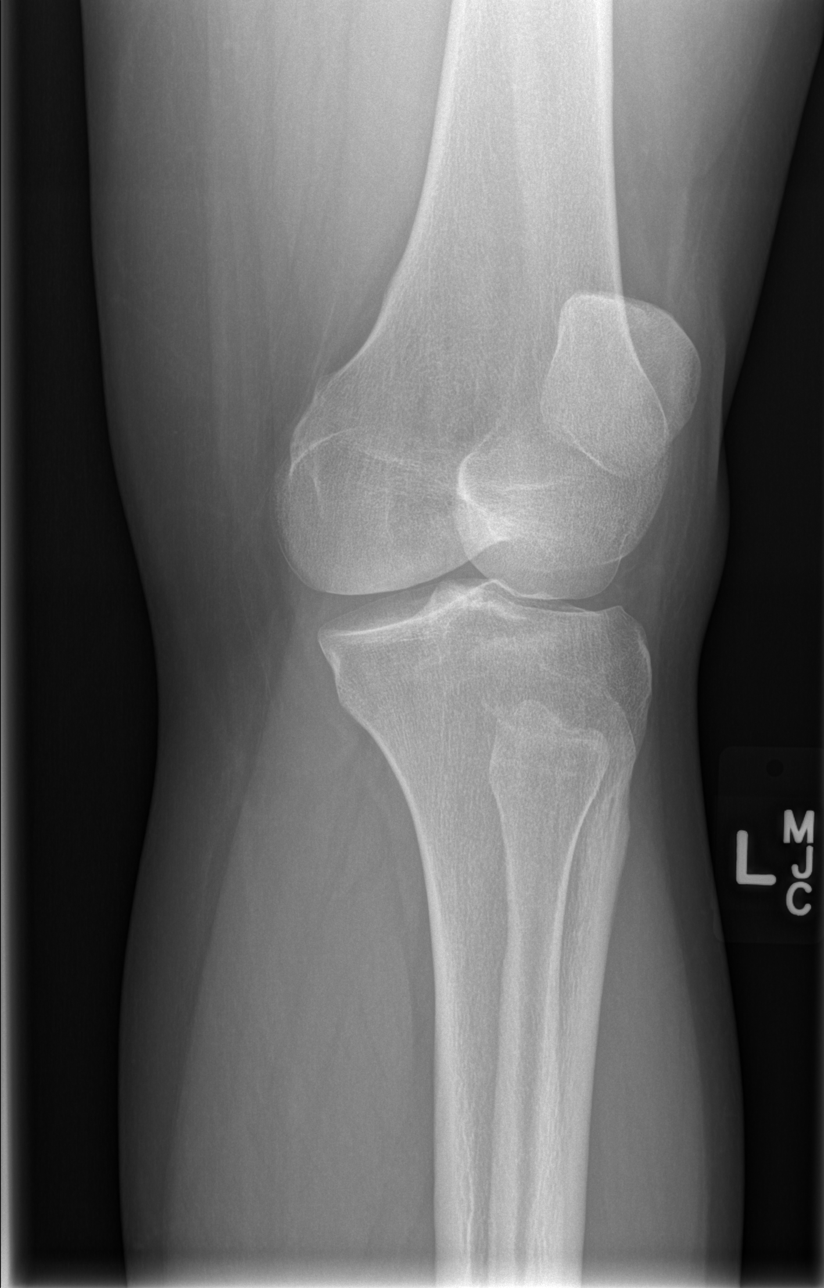

[t knee lat left]
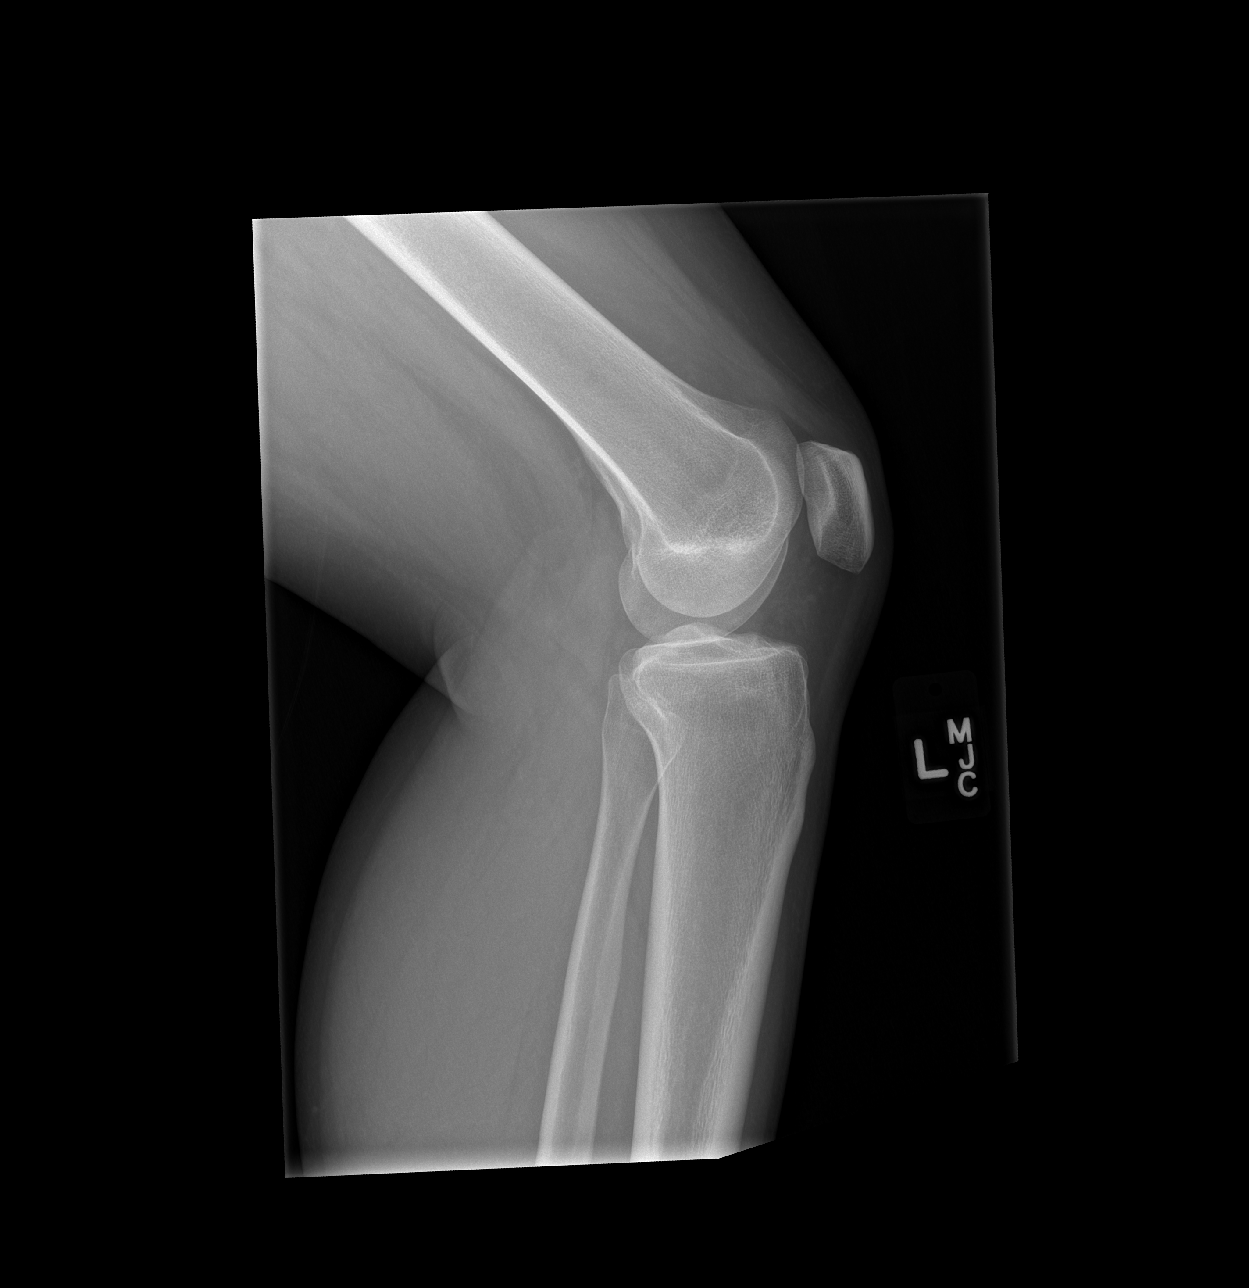

[4 of 4 positions shown; findings below may reference images not displayed]

FINDINGS: Four views of the left knee demonstrate no definite acute displaced
fracture, subluxation, dislocation, joint or soft tissue
abnormality.
IMPRESSION: 1. No acute radiographic abnormality of the left knee.

## 2014-12-31 ENCOUNTER — Ambulatory Visit: Payer: Self-pay | Admitting: Primary Care

## 2015-01-01 ENCOUNTER — Ambulatory Visit: Payer: Self-pay | Admitting: Primary Care

## 2015-01-01 ENCOUNTER — Encounter: Payer: Self-pay | Admitting: Primary Care

## 2015-01-01 DIAGNOSIS — J069 Acute upper respiratory infection, unspecified: Secondary | ICD-10-CM | POA: Insufficient documentation

## 2015-01-01 DIAGNOSIS — J00 Acute nasopharyngitis [common cold]: Secondary | ICD-10-CM

## 2015-01-01 DIAGNOSIS — R0989 Other specified symptoms and signs involving the circulatory and respiratory systems: Secondary | ICD-10-CM

## 2015-01-01 MED ORDER — ALBUTEROL SULFATE HFA 108 (90 BASE) MCG/ACT IN AERS *I*
1.0000 | INHALATION_SPRAY | Freq: Four times a day (QID) | RESPIRATORY_TRACT | 1 refills | Status: AC | PRN
Start: 2015-01-01 — End: ?

## 2015-01-01 MED ORDER — FLUTICASONE PROPIONATE 50 MCG/ACT NA SUSP *I*
1.0000 | Freq: Every day | NASAL | 5 refills | Status: DC
Start: 2015-01-01 — End: 2023-09-19

## 2015-01-01 NOTE — Progress Notes (Signed)
New Patient Visit    SUBJECTIVE  Patient here to establish care  Prior provider was: No PCP   Concerns today include: Chest congestion  and cold     URI : Chest  Congestion  , cold , since Tuesday , started as sore throat , worse last nigh , chest hurts , sneezing + , nasal diacharge  ( yellowish  ) , cough + (dry and  productive (brownish + ) , subjective fever , chills - , pleuritic chest pain , son is sick , itchy and water eyes .No sob, no wheezing , No ear pressure , No sinus pressure     ROS   Systemic symptoms: Not feeling tired (fatigue).  No fever, no chills, and no recent weight change.  Head symptoms: No headache.  Eye symptoms: No vision problems.  Otolaryngeal symptoms:  As per HPI   Cardiovascular symptoms: No chest pain or discomfort, no palpitations, and no intermittent leg claudication.  Pulmonary symptoms: No dyspnea, no cough, and no wheezing.  Gastrointestinal symptoms: Normal appetite, no heartburn, no nausea, no vomiting, no abdominal pain, no change in stool, no diarrhea, and no constipation.  Genitourinary symptoms: No hematuria  and no increase in urinary frequency.  No dysuria.  Hematologic symptoms: No easy bleeding  and no tendency for easy bruising.  Musculoskeletal symptoms: No back pain  and no myalgias.  No localized joint pain  and no localized joint swelling.  Neurological symptoms: No dizziness, no lightheadedness, no fainting, no memory lapses or loss, no motor disturbances, and no sensory disturbances.  Psychological symptoms: No anxiety  and no depression.  Skin symptoms: No pruritus  and no rash    Patient Active Problem List   Diagnosis Code    Acute rhinitis J00    Chest congestion R09.89    URI (upper respiratory infection) J06.9      Past Medical History   Diagnosis Date    Anxiety     Complication of anesthesia      n/v after anesthetics per pt    Kidney stones     Mood disorder     Nephrolithiasis      Past Surgical History   Procedure Laterality Date     Lithotripsy      Leep       Family History   Problem Relation Age of Onset    Anxiety disorder Mother     No Known Problems Father      Social History     Social History    Marital status: Single     Spouse name: N/A    Number of children: N/A    Years of education: N/A     Occupational History    Not on file.     Social History Main Topics    Smoking status: Current Every Day Smoker     Packs/day: 0.50     Years: 10.00     Types: Cigarettes    Smokeless tobacco: Not on file    Alcohol use 0.0 oz/week     0 Cans of beer per week      Comment: occas 1x/ wk    Drug use: No    Sexual activity: Yes     Partners: Male     Birth control/ protection: None     Other Topics Concern    Not on file     Social History Narrative     No Known Allergies (drug, envir, food or latex)  Review of Systems: Complete ROS reviewed by MD/PA and entered by nursing.    Current Outpatient Prescriptions   Medication Sig    fluticasone (FLONASE) 50 MCG/ACT nasal spray 1 spray by Nasal route daily    albuterol HFA 108 (90 BASE) MCG/ACT inhaler Inhale 1-2 puffs into the lungs every 6 hours as needed   Shake well before each use.     OBJECTIVE  Blood pressure 120/80, pulse 93, height 1.753 m ( ), weight 107.5 kg (237 lb), SpO2 96 %.  Body mass index is 35 kg/(m^2).       GENERAL: Alert, in no apparent distress, good attention, well groomed.  Obese Habitus.  SKIN: no jaundice  EYES: Anicteric, no exophthalmos.  Lids not drooping.  Sclera normal.  EARS: Normal pinnae.  External auditory canal intact, clear, and without lesions.  Normal tympanic membrane with light reflex and landmarks.  Acuity to conversational tones is good.  NOSE: Normal pink mucosa without swollen turbinates.  Septum appears midline.  MOUTH: Moist mucous membranes, normal pharynx, palate symmetrical, no thrush, normal tongue, no tonsillar exudate.  Gums pink.  No apparent Masses or ulcerations.  NECK: Nontender spinous processes.  Full ROM grossly.  No neck  stiffness.  LYMPH:  no cervical or supraclavicular lymphadenopathy.   CHEST: Clear to auscultation bilaterally with good air movement; and no wheezes or rales.  No accessory muscle usage.  Respirations unlabored.  Chest wall movement symmetric.                                                            ASSESSMENT & PLAN  1. Acute rhinitis./  Chest congestion/ . Acute nasopharyngitis  1) Patient educated on indication, use and side effects of antibiotics. Recommendation is conservative management in absence of bacterial infection at this time, continue to monitor for signs of worsening infection.  2) Rest and drink plenty of fluids. Continue home therapies (warm salt water gargles, throat lozenges, hot teas with honey, moist/humidified steam, cool mist vaporisers, cough suppressant) as needed for symptomatic relief.  3) Start behind-the-counter antihistamine with decongestant: fexofenadine (Allegra D), loratidine (Claritin D) or cetirizine (Zyrtec D) for otalgia and URI symptoms. Follow label for appropriate dosing instructions.   4) Encourage smoking cessation.     - fluticasone (FLONASE) 50 MCG/ACT nasal spray; 1 spray by Nasal route daily  Dispense: 16 g; Refill: 5  - albuterol HFA 108 (90 BASE) MCG/ACT inhaler; Inhale 1-2 puffs into the lungs every 6 hours as needed   Shake well before each use.  Dispense: 1 Inhaler; Refill: 1      Pt agrees with and understands plan.    Return if symptoms worsen or fail to improve.     Adelene Amas, MBBS

## 2015-01-05 ENCOUNTER — Encounter: Payer: Self-pay | Admitting: Primary Care

## 2015-01-19 ENCOUNTER — Ambulatory Visit: Payer: Self-pay | Admitting: Primary Care

## 2015-01-19 DIAGNOSIS — R198 Other specified symptoms and signs involving the digestive system and abdomen: Secondary | ICD-10-CM

## 2015-01-19 DIAGNOSIS — F17210 Nicotine dependence, cigarettes, uncomplicated: Secondary | ICD-10-CM

## 2015-01-19 DIAGNOSIS — F17200 Nicotine dependence, unspecified, uncomplicated: Secondary | ICD-10-CM | POA: Insufficient documentation

## 2015-01-19 MED ORDER — IBUPROFEN 800 MG PO TABS *I*
800.0000 mg | ORAL_TABLET | Freq: Three times a day (TID) | ORAL | 0 refills | Status: DC | PRN
Start: 2015-01-19 — End: 2018-05-02

## 2015-01-19 MED ORDER — SULFAMETHOXAZOLE-TRIMETHOPRIM 800-160 MG PO TABS *I*
1.0000 | ORAL_TABLET | Freq: Two times a day (BID) | ORAL | 0 refills | Status: DC
Start: 2015-01-19 — End: 2015-02-17

## 2015-01-19 NOTE — Addendum Note (Signed)
Addended by: Collier BullockEWAAL, Naida Escalante J on: 01/19/2015 11:47 AM     Modules accepted: Orders

## 2015-01-19 NOTE — Progress Notes (Signed)
Patient ID: Amy Osborn is a 29 y.o. year old female.    Chief Complaint   Patient presents with    discahrge from belly button     HPI:    1/2 ppd  ( use to soke 1 ppd ) , started 12 years , since 1 year smoking   My belly button , pain , sit down or gp to raech some thing causes pain , diaschrge ( orange brownish ) 3 days ago , on and off , couple of years ago ,   " My belly button is giving me problem " , it hurts when sit down or go to raech some thing  , noticed diaschrge (  brownish ) 3 days ago  , pt endorsed some time swelling and redness is also present , symptoms started couple of years ago , and they are intermittent . Pain is dull in character , non radiating , 4-5/10 on pain scale , intermittent . Rest helped in pain .   Pt denies fever, chills, nausea, vomiting , trauma , hx of piercing around umbilicus 9 years , removed at that time , since than no piercing .    Nicotine dependence : currently 1/2 ppd since 1 year  ( use to smoke 1 ppd ) , currently taking Wellbutrin , helping little bit , pt reported on and off cough .       Allergy / History / Medications:  No Known Allergies (drug, envir, food or latex)  Past Medical History   Diagnosis Date    Anxiety     Complication of anesthesia      n/v after anesthetics per pt    Kidney stones     Mood disorder     Nephrolithiasis      Past Surgical History   Procedure Laterality Date    Lithotripsy      Leep       Family History   Problem Relation Age of Onset    Anxiety disorder Mother     No Known Problems Father      Social History     Social History    Marital status: Single     Spouse name: N/A    Number of children: N/A    Years of education: N/A     Social History Main Topics    Smoking status: Current Every Day Smoker     Packs/day: 0.50     Years: 10.00     Types: Cigarettes    Smokeless tobacco: Not on file    Alcohol use 0.0 oz/week     0 Cans of beer per week      Comment: occas 1x/ wk    Drug use: No    Sexual activity: Yes      Partners: Male     Birth control/ protection: None     Other Topics Concern    Not on file     Social History Narrative     Current Outpatient Prescriptions   Medication Sig    buPROPion (WELLBUTRIN XL) 150 MG 24 hr tablet Take 125 mg by mouth 2 times daily   Swallow whole. Do not crush, break, or chew.    fluticasone (FLONASE) 50 MCG/ACT nasal spray 1 spray by Nasal route daily    albuterol HFA 108 (90 BASE) MCG/ACT inhaler Inhale 1-2 puffs into the lungs every 6 hours as needed   Shake well before each use.    sulfamethoxazole-trimethoprim (BACTRIM DS,SEPTRA DS)  800-160 MG per tablet Take 1 tablet by mouth 2 times daily    ibuprofen (ADVIL,MOTRIN) 800 MG tablet Take 1 tablet (800 mg total) by mouth 3 times daily as needed for Pain     No current facility-administered medications for this visit.          Physical Exam:     Vitals:    01/19/15 1105   BP: 112/72   Pulse: 82   Weight: 108.9 kg (240 lb)   Height: 1.753 m ( )     SpO2 Readings from Last 3 Encounters:   01/19/15 98%   01/01/15 96%   05/30/12 99%     Estimated body mass index is 35.44 kg/(m^2) as calculated from the following:    Height as of this encounter: 1.753 m ( ).    Weight as of this encounter: 108.9 kg (240 lb).     GENERAL: Alert, in no apparent distress,.  Obese  Habitus.  Appears stated age.    ABDOMEN: Soft, nontender, nondistended, no overt hepatosplenomegaly, thick foul smell brownish discharge noticed around umbilicus as well center of umbilicus . No redness , no swelling , + mild tenderness on cleaning and taking sample for Culture .    EXTREMITIES: No clubbing, no synovitis.  No obvious joint deformities.    NEURAL EXAM: Normal speech.  Gait coordinated and smooth.        Recent Lab Results:  No results found for: NA, K, CL, CO2, UN, CREAT, VID25, WBC, HGB, HCT, PLT, TSH, CHOL, TRIG, HDL, LDLC, CHHDC    Assessment and Plan:     1. Cigarette nicotine dependence without complication  [facility] Smoking Cessation Plan    Discussed  smoking cessation with patient. Patient readiness to quit: Yes  Discussed/counseled with patient smoking cessation plan according to USPSTF guidelines: I advised patient to quit, and offered support.  Discussed current use pattern.  Wellbutrin: denies h/o seizures or eating disorders.    2. Umbilical discharge    - sulfamethoxazole-trimethoprim (BACTRIM DS,SEPTRA DS) 800-160 MG per tablet; Take 1 tablet by mouth 2 times daily  Dispense: 20 tablet; Refill: 0  - ibuprofen (ADVIL,MOTRIN) 800 MG tablet; Take 1 tablet (800 mg total) by mouth 3 times daily as needed for Pain  Dispense: 30 tablet; Refill: 0  - Aerobic culture and gram stain (Wound/Drainage); Future      Return if symptoms worsen or fail to improve.

## 2015-01-20 LAB — GRAM STAIN

## 2015-01-22 LAB — AEROBIC CULTURE

## 2015-02-03 DIAGNOSIS — E282 Polycystic ovarian syndrome: Secondary | ICD-10-CM | POA: Insufficient documentation

## 2015-02-17 ENCOUNTER — Ambulatory Visit: Payer: Self-pay | Admitting: Primary Care

## 2015-02-17 ENCOUNTER — Encounter: Payer: Self-pay | Admitting: Primary Care

## 2015-02-17 VITALS — BP 110/80 | HR 88 | Wt 240.4 lb

## 2015-02-17 DIAGNOSIS — J069 Acute upper respiratory infection, unspecified: Secondary | ICD-10-CM

## 2015-02-17 MED ORDER — AZITHROMYCIN 250 MG PO TABS *I*
ORAL_TABLET | ORAL | 0 refills | Status: AC
Start: 2015-02-17 — End: 2015-02-22

## 2015-02-17 MED ORDER — PROMETHAZINE-CODEINE 6.25-10 MG/5ML PO SYRP *A*
5.0000 mL | ORAL_SOLUTION | Freq: Three times a day (TID) | ORAL | 0 refills | Status: DC | PRN
Start: 2015-02-17 — End: 2015-04-17

## 2015-02-17 NOTE — Progress Notes (Signed)
Patient ID: Amy Osborn is a 29 y.o. year old female.    Chief Complaint   Patient presents with    Cough    Sinus Problem     HPI:  My chest hurts lot , coughing very hard with nose bleeds , temple , nose , ear hurts , like I am under water since about a week , tried fluid s, OTC medications ( sinus , cold and flu medicinec, vit C , rest ) no help , symptoms are getting  worse .   Phlegm is brownish colored , mostly all the time , wakes up from sleep . No fever , no chills , no nausea, no vomiting , apatite is down . Using inahler not helping , leaving for cruise next week . Pt also endorsed sinus pressure , eyes hurting , + wheezing , + sob . No sick contact . Smoking + .       Allergy / History / Medications:  No Known Allergies (drug, envir, food or latex)     Allergies reviewed with pt and updated in epic today .     Past Medical History   Diagnosis Date    Anxiety     Complication of anesthesia      n/v after anesthetics per pt    Kidney stones     Mood disorder     Nephrolithiasis      Past Surgical History   Procedure Laterality Date    Lithotripsy      Leep       Family History   Problem Relation Age of Onset    Anxiety disorder Mother     No Known Problems Father      Social History     Social History    Marital status: Single     Spouse name: N/A    Number of children: N/A    Years of education: N/A     Social History Main Topics    Smoking status: Current Every Day Smoker     Packs/day: 0.50     Years: 10.00     Types: Cigarettes    Smokeless tobacco: None    Alcohol use 0.0 oz/week     0 Cans of beer per week      Comment: occas 1x/ wk    Drug use: No    Sexual activity: Yes     Partners: Male     Birth control/ protection: None     Other Topics Concern    None     Social History Narrative     Current Outpatient Prescriptions   Medication Sig    buPROPion (WELLBUTRIN XL) 150 MG 24 hr tablet Take 125 mg by mouth 2 times daily   Swallow whole. Do not crush, break, or chew.     ibuprofen (ADVIL,MOTRIN) 800 MG tablet Take 1 tablet (800 mg total) by mouth 3 times daily as needed for Pain    fluticasone (FLONASE) 50 MCG/ACT nasal spray 1 spray by Nasal route daily    albuterol HFA 108 (90 BASE) MCG/ACT inhaler Inhale 1-2 puffs into the lungs every 6 hours as needed   Shake well before each use.    azithromycin (ZITHROMAX) 250 MG tablet Take 2 tablets (500 mg) on day 1, followed by 1 tablet (250 mg) on days 2 through 5.    promethazine-codeine (PHENERGAN WITH CODEINE) 6.25-10 MG/5ML syrup Take 5 mLs by mouth 3 times daily as needed for Cough   Max  daily dose: 15 mLs     No current facility-administered medications for this visit.        Physical Exam:     Vitals:    02/17/15 1144   BP: 110/80   Pulse: 88   Weight: 109 kg (240 lb 6.4 oz)     SpO2 Readings from Last 3 Encounters:   02/17/15 98%   01/19/15 98%   01/01/15 96%     Estimated body mass index is 35.5 kg/(m^2) as calculated from the following:    Height as of 01/19/15: 1.753 m ( ).    Weight as of this encounter: 109 kg (240 lb 6.4 oz).    GENERAL: Alert, in no apparent distress, good attention, well groomed.   SKIN: no jaundice  EYES: Anicteric, no exophthalmos.  Lids not drooping.  Sclera normal.  EARS: Normal pinnae.  External auditory canal intact, clear, and without lesions.  Normal tympanic membrane with light reflex and landmarks.    MOUTH: Moist mucous membranes, normal pharynx, palate symmetrical, no thrush, normal tongue, no tonsillar exudate.  Gums pink.  No apparent Masses or ulcerations.  NECK: Nontender spinous processes.  Full ROM grossly.  No neck stiffness.  LYMPH:  no cervical or supraclavicular lymphadenopathy.   CHEST: Clear to auscultation bilaterally with good air movement; and no wheezes or rales.  No accessory muscle usage.  Respirations unlabored.  Chest wall movement symmetric.    Recent Lab Results:  No results found for: NA, K, CL, CO2, UN, CREAT, VID25, WBC, HGB, HCT, PLT, TSH, CHOL, TRIG, HDL,  LDLC, CHHDC    Assessment and Plan:     1. Upper respiratory tract infection, unspecified type  Likely Bronchitis , hx of similar symptoms last month ( not completely gone )   Start antibiotic as instructed  Take Mucinex OTC twice a day  Drink lots of water at least  3 glass daily  12 onz each  High dose vitamin C daily x 5 days (1000 Unit) it is OTC  - azithromycin (ZITHROMAX) 250 MG tablet; Take 2 tablets (500 mg) on day 1, followed by 1 tablet (250 mg) on days 2 through 5.  Dispense: 6 tablet; Refill: 0  - promethazine-codeine (PHENERGAN WITH CODEINE) 6.25-10 MG/5ML syrup; Take 5 mLs by mouth 3 times daily as needed for Cough   Max daily dose: 15 mLs  Dispense: 120 mL; Refill: 0  -Continue rescue inhaler as needed .       Return if symptoms worsen or fail to improve.

## 2015-04-17 ENCOUNTER — Ambulatory Visit: Payer: Self-pay | Admitting: Primary Care

## 2015-04-17 ENCOUNTER — Encounter: Payer: Self-pay | Admitting: Primary Care

## 2015-04-17 DIAGNOSIS — H109 Unspecified conjunctivitis: Secondary | ICD-10-CM

## 2015-04-17 DIAGNOSIS — H10029 Other mucopurulent conjunctivitis, unspecified eye: Secondary | ICD-10-CM | POA: Insufficient documentation

## 2015-04-17 MED ORDER — CIPROFLOXACIN HCL 0.3 % OP SOLN *I*
1.0000 [drp] | Freq: Four times a day (QID) | OPHTHALMIC | 1 refills | Status: DC
Start: 2015-04-17 — End: 2015-07-07

## 2015-04-17 NOTE — Patient Instructions (Addendum)
Wash your hands with soap and water often.   Wash your hands before and after you touch your eyes.   Avoid allergens.   Try to avoid the things that cause your allergies, such as pets, dust, or grass.   Avoid contact with others.   Do not share towels or washcloths. .  Throw away eye makeup.

## 2015-04-17 NOTE — Progress Notes (Addendum)
Patient ID: Amy Osborn is a 30 y.o. year old female.    Chief Complaint   Patient presents with    Conjunctivitis     HPI:  Yesterday woke up with crsuty , red and pain ful  Rt eye , still hurting , feeling dry , no itching , lot of crsuts , noticed greenish discharge  , no vision changes .  No fever, no chills , no sore throat . Baby has pink eye last week , seen  in ED recommended antibiotic eye ointmemnt ,     Allergy / History / Medications: reviewed with pt and updated in epic today .   No Known Allergies (drug, envir, food or latex)  Past Medical History   Diagnosis Date    Anxiety     Complication of anesthesia      n/v after anesthetics per pt    Kidney stones     Mood disorder     Nephrolithiasis      Past Surgical History   Procedure Laterality Date    Lithotripsy      Leep       Family History   Problem Relation Age of Onset    Anxiety disorder Mother     No Known Problems Father      Social History     Social History    Marital status: Single     Spouse name: N/A    Number of children: N/A    Years of education: N/A     Social History Main Topics    Smoking status: Current Every Day Smoker     Packs/day: 0.50     Years: 10.00     Types: Cigarettes    Smokeless tobacco: Not on file    Alcohol use 0.0 oz/week     0 Cans of beer per week      Comment: occas 1x/ wk    Drug use: No    Sexual activity: Yes     Partners: Male     Birth control/ protection: None     Other Topics Concern    Not on file     Social History Narrative     Current Outpatient Prescriptions   Medication Sig    ibuprofen (ADVIL,MOTRIN) 800 MG tablet Take 1 tablet (800 mg total) by mouth 3 times daily as needed for Pain    fluticasone (FLONASE) 50 MCG/ACT nasal spray 1 spray by Nasal route daily    albuterol HFA 108 (90 BASE) MCG/ACT inhaler Inhale 1-2 puffs into the lungs every 6 hours as needed   Shake well before each use.     No current facility-administered medications for this visit.          Physical Exam:      Vitals:    04/17/15 0955   BP: 120/78   Pulse: 77   Temp: 36.4 C (97.6 F)   Weight: 110.2 kg (243 lb)   Height: 1.753 m ( )     SpO2 Readings from Last 3 Encounters:   04/17/15 99%   02/17/15 98%   01/19/15 98%     Estimated body mass index is 35.88 kg/(m^2) as calculated from the following:    Height as of this encounter: 1.753 m ( ).    Weight as of this encounter: 110.2 kg (243 lb).    EYE:  Right eye ,  -- conjunctiva is diffusely red  -- no photosensitivity  -- pupils equally round and reactive to  light  -- no eyelid swelling  -- no eyelid tenderness  -- no obvious opacity to cornea  -- Greenish eye discharge  -- facial skin without blisters or other rash  LYMPH:  No cervical or preauricular lymphadenopathy    Left Eye : Normal     Recent Lab Results:  No results found for: NA, K, CL, CO2, UN, CREAT, VID25, WBC, HGB, HCT, PLT, TSH, CHOL, TRIG, HDL, LDLC, CHHDC    Assessment and Plan:     1. Conjunctivitis    - ciprofloxacin (CILOXAN) 0.3 % ophthalmic solution; Place 1 drop into both eyes every 6 hours  Dispense: 5 mL; Refill: 1      Return if symptoms worsen or fail to improve.    Also see patient instructions     An After Visit Summary was printed and given to the patient .

## 2015-06-09 ENCOUNTER — Telehealth: Payer: Self-pay | Admitting: Primary Care

## 2015-06-09 NOTE — Telephone Encounter (Signed)
LVM for patient attempting to find out who the patient will select as PCP (Miraloglu/Ashok/Other).

## 2015-06-10 NOTE — Telephone Encounter (Signed)
Spoke with patient - she will be switching to Peter Garterobert Blackburn, MD at [facility].

## 2015-07-07 ENCOUNTER — Ambulatory Visit
Admission: AD | Admit: 2015-07-07 | Discharge: 2015-07-07 | Disposition: A | Discharge status: Home / Self Care | Payer: Self-pay

## 2015-07-07 DIAGNOSIS — B001 Herpesviral vesicular dermatitis: Secondary | ICD-10-CM

## 2015-07-07 DIAGNOSIS — Z8619 Personal history of other infectious and parasitic diseases: Secondary | ICD-10-CM

## 2015-07-07 MED ORDER — VALACYCLOVIR HCL 1000 MG PO TABS *I*
2000.0000 mg | ORAL_TABLET | Freq: Two times a day (BID) | ORAL | 0 refills | Status: AC
Start: 2015-07-07 — End: 2015-07-08

## 2015-07-07 NOTE — UC Provider Note (Signed)
History     Chief Complaint   Patient presents with    Mouth Lesions     this morning cold sore on Left upper lip. Took Valtrex this morning.     HPI Comments: Patient is a 30 year old female who presents with cold sore on her left upper lip.  Patient has a prior history of herpes simplex labialis, and states her last culture was 8 months ago.  She noticed a cold sore this morning along with the corner of her left upper lip, and notes mild prickly pain along the affected lesion.  She denies any other rash, any sore throat, oral/tongue lesions, or cough.  She had one 1000 mg Valtrex pill leftover, and states she took that early this morning.  She denies any other constitutional or systemic symptoms, no vaginal symptoms, no fever/chills.  She has an infant at home, which she has been avoiding coast contact with.      History provided by:  Patient  Language interpreter used: No    Is this ED visit related to civilian activity for income:  Not work related      Past Medical History:   Diagnosis Date    Anxiety     Complication of anesthesia     n/v after anesthetics per pt    Kidney stones     Kidney stones     Mood disorder     Nephrolithiasis             Past Surgical History:   Procedure Laterality Date    LEEP      LITHOTRIPSY         Family History   Problem Relation Age of Onset    Anxiety disorder Mother     No Known Problems Father          Social History    reports that she has been smoking Cigarettes.  She has a 5.00 pack-year smoking history. She does not have any smokeless tobacco history on file. She reports that she drinks alcohol. She reports that she currently engages in sexual activity and has had female partners. She reports using the following method of birth control/protection: None. She reports that she does not use illicit drugs.    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   HENT: Negative for congestion, mouth sores, sore throat and trouble swallowing.    Respiratory: Negative for cough and shortness of breath.    Cardiovascular: Negative for chest pain.   Gastrointestinal: Negative for diarrhea, nausea and vomiting.   Genitourinary: Negative.  Negative for vaginal bleeding, vaginal discharge and vaginal pain.   Musculoskeletal: Negative for arthralgias, joint swelling and myalgias.   Skin: Positive for rash (cold sore left upper lip). Negative for color change, pallor and wound.   Allergic/Immunologic: Negative for environmental allergies, food allergies and immunocompromised state.   Neurological: Negative.    Hematological: Negative for adenopathy.       Physical Exam     ED Triage Vitals   BP Heart Rate Heart Rate (via Pulse Ox) Resp Temp  Temp src SpO2 O2 Device O2 Flow Rate   07/07/15 1102 07/07/15 1102 -- 07/07/15 1102 07/07/15 1102 -- 07/07/15 1102 07/07/15 1102 --   110/66 72  16 36.6 C (97.9 F)  98 % None (Room air)       Weight           07/07/15 1102           108 kg (238 lb)                    Physical Exam   Constitutional: She is oriented to person, place, and time. She appears well-developed and well-nourished. She does not appear ill. No distress.   HENT:   Head: Normocephalic and atraumatic.   Right Ear: External ear normal.   Left Ear: External ear normal.   Nose: Nose normal.   Mouth/Throat: Uvula is midline, oropharynx is clear and moist and mucous membranes are normal. No trismus in the jaw. No uvula swelling. No oropharyngeal exudate, posterior oropharyngeal erythema or tonsillar abscesses. No tonsillar exudate.       Neck: Normal range of motion. Neck supple.   Cardiovascular: S1 normal and S2 normal.    Musculoskeletal: Normal range of motion.   Neurological: She is alert and  oriented to person, place, and time.   Skin: Skin is warm and dry.   Psychiatric: She has a normal mood and affect.   Nursing note and vitals reviewed.       Medical Decision Making        Initial Evaluation:  ED First Provider Contact     Date/Time Event User Comments    07/07/15 1111 ED Provider First Contact Jaydien Panepinto Initial Face to Face Provider Contact          Patient seen by me on arrival date of 07/07/2015 at at time of arrival  10:02 AM.  Initial face to face evaluation time noted above may be discrepant due to patient acuity and delay in documentation.    Assessment:  30 y.o.female comes [facility] the [facility] with emergence of acute on chronic cold sore on her left upper lip that started this morning.  She took 1000 mg of Valtrex early this morning.  Afebrile with stable vital signs at clinic.    Differential Diagnosis includes   Herpes simplex labialis  Impetigo  Hand, Foot, and mouth disease  Viral disease NOS                Plan:   Orders Placed This Encounter    valACYclovir (VALTREX) 1 GM tablet       Final Diagnosis    ICD-10-CM ICD-9-CM   1. 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 H/O cold sores Z86.19 V12.09     HERPES LABIALIS (ENGLISH) View    COLD SORE, EASY-TO-READ (ENGLISH) View    Patient's Language: English   Instructions      Avoid kissing anyone or sharing drinks until symptoms have completely resolved. If able, avoid contact with small children, very elderly people, or pregnant women.  You may use over-the-counter Orajel topically for mild symptom relief over affected area.        Encourage fluids, encourage rest, good hand hygiene.    Use over the counter medications as discussed.    Please start the new medications as below:    Current Discharge Medication List      New Medications    Details Last Dose Given Next Dose Due  Script Given?   valACYclovir (VALTREX) 2,000 mg Dose: 2,000 mg  Take 2,000 mg by mouth 2 times daily  Quantity 4 tablet, Refill 0  Start date: 07/07/2015, End  date: 07/08/2015                   Please follow up with your physician as below:    Follow-up Information     Follow up with Peter Garter, MD. Go in 5 days.    Why:  If symptoms worsen or persist/do not improve    Contact information:    [address]  [place] [address]  [phone]          If short of breath, chest pains or any other concerns please report to the emergency room.    In the event of an Emergency dial 911.      Final Diagnosis  Final diagnoses:   [Z86.19] H/O cold sores   [B00.1] Herpes simplex labialis (Primary)           Merrily Brittle, PA    Supervising physician Dr. Delbert Harness   was immediately available     Merrily Brittle, [place]  07/07/15 1150

## 2015-07-07 NOTE — ED Triage Notes (Signed)
this morning cold sore on Left upper lip. Took Valtrex this morning.       Triage Note   Lawerance CruelMary E Evertte Sones, RN

## 2015-07-07 NOTE — Discharge Instructions (Signed)
Avoid kissing anyone or sharing drinks until symptoms have completely resolved.  If able, avoid contact with small children, very elderly people, or pregnant women.  You may use over-the-counter Orajel topically for mild symptom relief over affected area.

## 2016-04-14 ENCOUNTER — Other Ambulatory Visit: Payer: Self-pay | Admitting: Primary Care

## 2016-04-14 DIAGNOSIS — J00 Acute nasopharyngitis [common cold]: Secondary | ICD-10-CM

## 2016-04-14 DIAGNOSIS — R0989 Other specified symptoms and signs involving the circulatory and respiratory systems: Secondary | ICD-10-CM

## 2016-07-12 DIAGNOSIS — F419 Anxiety disorder, unspecified: Secondary | ICD-10-CM | POA: Insufficient documentation

## 2016-07-17 ENCOUNTER — Ambulatory Visit
Admission: AD | Admit: 2016-07-17 | Discharge: 2016-07-17 | Disposition: A | Discharge status: Home / Self Care | Payer: Medicaid Other | Source: Ambulatory Visit | Attending: Emergency Medicine | Admitting: Emergency Medicine

## 2016-07-17 DIAGNOSIS — H1031 Unspecified acute conjunctivitis, right eye: Secondary | ICD-10-CM | POA: Insufficient documentation

## 2016-07-17 MED ORDER — CIPROFLOXACIN HCL 0.3 % OP SOLN *I*
OPHTHALMIC | 0 refills | Status: DC
Start: 2016-07-17 — End: 2018-05-02

## 2016-07-17 NOTE — Discharge Instructions (Signed)
Use optical (eye) lubricants such as REFRESH NATURAL TEARS, SYSTANE, OR GENTEAL (NON MEDICATED) eye drops. Use them every 1-3 hours as needed if your eyes are dry, irritated or for comfort.    If you used any makeup in the past 24-48 hours, throw out any and all eye makeup (mascara, liner or eyeshadow) from 2 days before the eye irritation and do not use your new makeup until 2 days AFTER you are feeling better!     Change pillow cases every other day, flip pillows daily, wash eyes in am with warm/soapy water and rinse, then wash hands. Wash hands frequently with soap/water and/or hand sanitizer.    Orders Placed This Encounter    ciprofloxacin (CILOXAN) 0.3 % ophthalmic solution       No results found for this or any previous visit (from the past 24 hour(s)).        Final Diagnosis    ICD-10-CM ICD-9-CM   1. Acute bacterial conjunctivitis of right eye H10.31 372.03       Encourage fluids, encourage rest, good hand hygiene.    Use over the counter medications as discussed.    Please start the new medications as below:    Current Discharge Medication List      New Medications    Details Last Dose Given Next Dose Due Script Given?   ciprofloxacin (CILOXAN) 0.3 % ophthalmic solution 1-2 drops in affected eye(s) every four hours while awake x 5 days in affected eye(s).  Quantity 5 mL, Refill 0  Start date: 07/17/2016                   Please follow up with your physician as below:    Follow-up Information     Follow up with Peter Garter, MD. Schedule an appointment as soon as   possible for a visit in 1 week.    Specialty:  Family Medicine    Why:  As needed, If symptoms worsen    Contact information:    [address]  [place] [address]  [phone]          Thank you Brendan Trivedi for coming to [facility] for your health care concerns.    If your condition changes and/or worsens please follow up with her primary doctor and/or return to the urgent care center.    If short of breath, chest pains or any other  concerns please report to the emergency room.    In the event of an Emergency dial 911.

## 2016-07-17 NOTE — ED Triage Notes (Signed)
c/o redness and discomfort in right eye. Multiple family members with conjunctivitis recently.        Triage Note   Mercer Stallworth P Larya Charpentier, RN

## 2016-07-17 NOTE — UC Provider Note (Signed)
History     Chief Complaint   Patient presents with    Eye Problem     c/o redness and discomfort in right eye. Multiple family members with conjunctivitis recently.      HPI Comments: Pt with right eye redness and discomfort that started to get worse this am when she woke up this am, she used her son's old eye drop and it got worse today. Many family members with similar complaints. Denies FCNVD, no URI symptoms. No visual complaints. Eye is red, irritated and goopy discharge from time to time.       History provided by:  Patient  Language interpreter used: No        Past Medical History:   Diagnosis Date    Anxiety     Complication of anesthesia     n/v after anesthetics per pt    Kidney stones     Kidney stones     Mood disorder     Nephrolithiasis             Past Surgical History:   Procedure Laterality Date    LEEP      LITHOTRIPSY         Family History   Problem Relation Age of Onset    Anxiety disorder Mother     No Known Problems Father          Social History    reports that she has been smoking Cigarettes.  She has a 5.00 pack-year smoking history. She has never used smokeless tobacco. She reports that she drinks alcohol. She reports that she currently engages in sexual activity and has had female partners. She reports using the following method of birth control/protection: None. She reports that she does not use illicit drugs.    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  Negative for appetite change, chills and fever.   HENT: Negative for congestion, postnasal drip, sinus pressure, sore throat, trouble swallowing and voice change.    Eyes: Positive for pain, discharge, redness and itching. Negative for photophobia and visual disturbance.    Respiratory: Negative for cough.    Cardiovascular: Negative.    Gastrointestinal: Negative for abdominal pain.   Musculoskeletal: Negative.  Negative for myalgias and neck pain.   Skin: Negative.  Negative for rash.   Allergic/Immunologic: Negative.  Negative for environmental allergies.   Neurological: Negative for headaches.   Hematological: Negative for adenopathy.       Physical Exam     ED Triage Vitals   BP Heart Rate Heart Rate (via Pulse Ox) Resp Temp Temp src SpO2 (Retired) O2 Device O2 Flow Rate   07/17/16 1825 07/17/16 1825 -- 07/17/16 1825 07/17/16 1825 07/17/16 1825 07/17/16 1825 -- --   104/59 63  16 36.7 C (98.1 F) TEMPORAL 100 %        Weight           --                               Physical Exam  Constitutional: She is oriented to person, place, and time. She appears well-developed and well-nourished.   HENT:   Head: Normocephalic and atraumatic.   Right Ear: Hearing, tympanic membrane, external ear and ear canal normal.   Left Ear: Hearing, tympanic membrane, external ear and ear canal normal.   Nose: Nose normal. No mucosal edema or rhinorrhea. Right sinus exhibits no maxillary sinus tenderness and no frontal sinus tenderness. Left sinus exhibits no maxillary sinus tenderness and no frontal sinus tenderness.   Mouth/Throat: Uvula is midline and mucous membranes are normal. Posterior oropharyngeal erythema (very mild redness, non tender per pt.) present. No oropharyngeal exudate.   Eyes: EOM and lids are normal. Pupils are equal, round, and reactive to light. Right eye exhibits chemosis and discharge. Right eye exhibits no exudate and no hordeolum. No foreign body present in the right eye. Left eye exhibits no chemosis, no discharge, no exudate and no hordeolum. No foreign body present in the left eye. Right conjunctiva is injected (right worse than left). Left conjunctiva is injected (starting to get injected in left eye as well). No scleral icterus.   Fundoscopic exam:       The right eye  shows no papilledema. The right eye shows red reflex.        The left eye shows no papilledema. The left eye shows red reflex.   Neck: Trachea normal, normal range of motion, full passive range of motion without pain and phonation normal. Neck supple.   Cardiovascular: Normal rate, regular rhythm, normal heart sounds and intact distal pulses.    Pulmonary/Chest: Effort normal and breath sounds normal. No respiratory distress. She has no wheezes.   Musculoskeletal: Normal range of motion. She exhibits no tenderness.   Lymphadenopathy:     She has cervical adenopathy.        Right cervical: Superficial cervical adenopathy present.        Left cervical: Superficial cervical adenopathy present.   Neurological: She is alert and oriented to person, place, and time.   Skin: Skin is warm and dry. No rash noted.   Nursing note and vitals reviewed.       Medical Decision Making        Initial Evaluation:  ED First Provider Contact     Date/Time Event User Comments    07/17/16 1829 ED First Provider Contact Emonte Dieujuste R Initial Face to Face Provider Contact          Patient was seen on: 07/17/2016        Assessment:  31 y.o.female comes [facility] the [facility] with right eye redness, soreness, itching and discharge since early this am after using her son's conjunctivitis medications.    Differential Diagnosis includes acute conjunctivitis, bacterial vs viral                Plan: supportive measures given, OTC suggestions given, eye lubricant drops, f/u with pcp in 1 week,  sooner if worse. rx given (see below). Change pillow cases every other day, flip pillows daily, wash eyes in am with warm/soapy water and rinse, then wash hands. Wash hands frequently with soap/water and/or hand sanitizer.     Orders Placed This Encounter    ciprofloxacin (CILOXAN) 0.3 % ophthalmic solution       No results found for this or any previous visit (from the past 24 hour(s)).        Final Diagnosis    ICD-10-CM ICD-9-CM   1. Acute bacterial  conjunctivitis of right  eye H10.31 372.03       Encourage fluids, encourage rest, good hand hygiene.    Use over the counter medications as discussed.    Please start the new medications as below:    Current Discharge Medication List      New Medications    Details Last Dose Given Next Dose Due Script Given?   ciprofloxacin (CILOXAN) 0.3 % ophthalmic solution 1-2 drops in affected eye(s) every four hours while awake x 5 days in affected eye(s).  Quantity 5 mL, Refill 0  Start date: 07/17/2016                   Please follow up with your physician as below:    Follow-up Information     Follow up with Peter Garter, MD. Schedule an appointment as soon as   possible for a visit in 1 week.    Specialty:  Family Medicine    Why:  As needed, If symptoms worsen    Contact information:    [address]  [place] [address]  [phone]          Thank you Makensie Holzworth for coming to [facility] for your health care concerns.    If your condition changes and/or worsens please follow up with her primary doctor and/or return to the urgent care center.    If short of breath, chest pains or any other concerns please report to the emergency room.    In the event of an Emergency dial 911.      Final Diagnosis  Final diagnoses:   [H10.31] Acute bacterial conjunctivitis of right eye (Primary)           Maxcine Ham, MD       Kirby Crigler, MD  07/17/16 [phone]

## 2016-11-02 ENCOUNTER — Other Ambulatory Visit
Admission: RE | Admit: 2016-11-02 | Discharge: 2016-11-02 | Disposition: A | Discharge status: Home / Self Care | Payer: Medicaid Other | Source: Ambulatory Visit | Attending: Physician Assistant | Admitting: Physician Assistant

## 2016-11-02 DIAGNOSIS — N10 Acute tubulo-interstitial nephritis: Secondary | ICD-10-CM | POA: Insufficient documentation

## 2016-11-02 LAB — COMPREHENSIVE METABOLIC PANEL
ALT: 55 U/L — ABNORMAL HIGH (ref 0–35)
AST: 38 U/L — ABNORMAL HIGH (ref 0–35)
Albumin: 4.5 g/dL (ref 3.5–5.2)
Alk Phos: 46 U/L (ref 35–105)
Anion Gap: 10 (ref 7–16)
Bilirubin,Total: 0.3 mg/dL (ref 0.0–1.2)
CO2: 27 mmol/L (ref 20–28)
Calcium: 9.3 mg/dL (ref 8.8–10.2)
Chloride: 105 mmol/L (ref 96–108)
Creatinine: 0.82 mg/dL (ref 0.51–0.95)
GFR,Black: 111 *
GFR,Caucasian: 96 *
Glucose: 81 mg/dL (ref 60–99)
Lab: 11 mg/dL (ref 6–20)
Potassium: 4.1 mmol/L (ref 3.3–5.1)
Sodium: 142 mmol/L (ref 133–145)
Total Protein: 6.7 g/dL (ref 6.3–7.7)

## 2017-10-09 ENCOUNTER — Other Ambulatory Visit
Admission: RE | Admit: 2017-10-09 | Discharge: 2017-10-09 | Disposition: A | Discharge status: Home / Self Care | Payer: Medicaid Other | Source: Ambulatory Visit | Attending: Obstetrics and Gynecology | Admitting: Obstetrics and Gynecology

## 2017-10-09 DIAGNOSIS — Z3481 Encounter for supervision of other normal pregnancy, first trimester: Secondary | ICD-10-CM | POA: Insufficient documentation

## 2017-10-09 LAB — CBC AND DIFFERENTIAL
Baso # K/uL: 0 10*3/uL (ref 0.0–0.1)
Basophil %: 0.2 %
Eos # K/uL: 0.1 10*3/uL (ref 0.0–0.4)
Eosinophil %: 0.6 %
Hematocrit: 43 % (ref 34–45)
Hemoglobin: 14 g/dL (ref 11.2–15.7)
IMM Granulocytes #: 0.1 10*3/uL
IMM Granulocytes: 0.5 %
Lymph # K/uL: 2.4 10*3/uL (ref 1.2–3.7)
Lymphocyte %: 18.4 %
MCH: 27 pg/cell (ref 26–32)
MCHC: 33 g/dL (ref 32–36)
MCV: 83 fL (ref 79–95)
Mono # K/uL: 0.7 10*3/uL (ref 0.2–0.9)
Monocyte %: 5.1 %
Neut # K/uL: 9.8 10*3/uL — ABNORMAL HIGH (ref 1.6–6.1)
Nucl RBC # K/uL: 0 10*3/uL (ref 0.0–0.0)
Nucl RBC %: 0 /100 WBC (ref 0.0–0.2)
Platelets: 193 10*3/uL (ref 160–370)
RBC: 5.2 MIL/uL (ref 3.9–5.2)
RDW: 14.3 % (ref 11.7–14.4)
Seg Neut %: 75.2 %
WBC: 13.1 10*3/uL — ABNORMAL HIGH (ref 4.0–10.0)

## 2017-10-09 LAB — TYPE AND SCREEN
ABO RH Blood Type: O POS
Antibody Screen: NEGATIVE

## 2017-10-10 LAB — HEMOGLOBIN ELECTROPHORESIS
Hgb A1: 97.3 % (ref 96.8–97.8)
Hgb A2: 2.7 % (ref 2.2–3.2)
Interp,HBE: NORMAL

## 2017-10-10 LAB — LEAD, BLOOD

## 2017-10-10 LAB — HEPATITIS B SURFACE ANTIGEN: HBV S Ag: NEGATIVE

## 2017-10-10 LAB — HIV 1&2 ANTIGEN/ANTIBODY: HIV 1&2 ANTIGEN/ANTIBODY: NONREACTIVE

## 2017-10-10 LAB — SYPHILIS SCREEN
Syphilis Screen: NEGATIVE
Syphilis Status: NONREACTIVE

## 2017-10-10 LAB — LEAD VENOUS: Lead,Venous: <1 ug/dl (ref 0–5)

## 2017-10-10 LAB — RUBELLA ANTIBODY, IGG: Rubella IgG AB: POSITIVE

## 2017-10-11 LAB — HGB ELECT,REVIEW

## 2017-10-16 LAB — CYSTIC FIBROSIS 106-MUTATION PANEL: Result Summary, CFP: NEGATIVE

## 2017-10-17 ENCOUNTER — Other Ambulatory Visit: Payer: Self-pay | Admitting: Gastroenterology

## 2017-10-17 ENCOUNTER — Other Ambulatory Visit
Admission: RE | Admit: 2017-10-17 | Discharge: 2017-10-17 | Disposition: A | Discharge status: Home / Self Care | Payer: Medicaid Other | Source: Ambulatory Visit | Attending: Obstetrics | Admitting: Obstetrics

## 2017-10-17 ENCOUNTER — Other Ambulatory Visit
Admission: RE | Admit: 2017-10-17 | Discharge: 2017-10-17 | Disposition: A | Discharge status: Home / Self Care | Payer: Medicaid Other | Source: Ambulatory Visit

## 2017-10-17 DIAGNOSIS — Z3480 Encounter for supervision of other normal pregnancy, unspecified trimester: Secondary | ICD-10-CM | POA: Insufficient documentation

## 2017-10-17 DIAGNOSIS — Z124 Encounter for screening for malignant neoplasm of cervix: Secondary | ICD-10-CM | POA: Insufficient documentation

## 2017-10-17 DIAGNOSIS — Z3481 Encounter for supervision of other normal pregnancy, first trimester: Secondary | ICD-10-CM | POA: Insufficient documentation

## 2017-10-18 LAB — N. GONORRHOEAE DNA AMPLIFICATION: N. gonorrhoeae DNA Amplification: 0

## 2017-10-18 LAB — CHLAMYDIA PLASMID DNA AMPLIFICATION: Chlamydia Plasmid DNA Amplification: 0

## 2017-10-19 ENCOUNTER — Other Ambulatory Visit
Admission: RE | Admit: 2017-10-19 | Discharge: 2017-10-19 | Disposition: A | Discharge status: Home / Self Care | Payer: Medicaid Other | Source: Ambulatory Visit | Attending: Obstetrics | Admitting: Obstetrics

## 2017-10-19 DIAGNOSIS — Z3491 Encounter for supervision of normal pregnancy, unspecified, first trimester: Secondary | ICD-10-CM | POA: Insufficient documentation

## 2017-10-23 LAB — GYN CYTOLOGY

## 2017-10-23 LAB — MATERNAL T21 PLUS

## 2017-11-14 ENCOUNTER — Other Ambulatory Visit
Admission: RE | Admit: 2017-11-14 | Discharge: 2017-11-14 | Disposition: A | Discharge status: Home / Self Care | Payer: Medicaid Other | Source: Ambulatory Visit | Attending: Obstetrics and Gynecology | Admitting: Obstetrics and Gynecology

## 2017-11-14 DIAGNOSIS — Z331 Pregnant state, incidental: Secondary | ICD-10-CM | POA: Insufficient documentation

## 2017-12-19 LAB — AEROBIC CULTURE: Aerobic Culture: 0

## 2017-12-20 LAB — AEROBIC CULTURE: Aerobic Culture: 0

## 2018-02-17 ENCOUNTER — Other Ambulatory Visit
Admission: RE | Admit: 2018-02-17 | Discharge: 2018-02-17 | Disposition: A | Discharge status: Home / Self Care | Payer: Medicaid Other | Source: Ambulatory Visit | Attending: Obstetrics | Admitting: Obstetrics

## 2018-02-17 DIAGNOSIS — Z3482 Encounter for supervision of other normal pregnancy, second trimester: Secondary | ICD-10-CM | POA: Insufficient documentation

## 2018-02-17 LAB — HEMATOCRIT: Hematocrit: 32 % — ABNORMAL LOW (ref 34–45)

## 2018-02-17 LAB — MCHC: MCHC: 33 g/dL (ref 32–36)

## 2018-02-17 LAB — GLUCOSE TOLERANCE, 1 HOUR: Glucose,50gm 1HR: 82 mg/dL (ref 63–135)

## 2018-03-28 NOTE — L&D Delivery Note (Signed)
Delivery Summary Note  Patient: Amy Osborn  Age: 33 y.o.  Date of Birth: [birth year]  [id number]  MRN: [id number]    Admission Summary  Date and time of admission: 05/02/2018  5:57 PM Attending Provider: Melina Fiddler, * Provider Group: [facility]  Active Hospital Problems    Diagnosis    *!*SVD (spontaneous vaginal delivery)     Allergies:   Patient has no known allergies (drug, envir, food or latex).  Weight:  PrePregnancy Weight: 76.2 kg (168 lb) Weight: 88.9 kg (196 lb) Pregnancy weight change (kg): 12.7 kg   OB History   Gravida Para Term Preterm AB Living   2 1 1  0 0 1   SAB TAB Ectopic Multiple Live Births   0 0 0 0 1      Breast or Formula Feeding: Breast feeding      Prenatal Labs  ABO RH Blood Type   Date Value Ref Range Status   05/02/2018 O RH POS  Final     Antibody Screen   Date Value Ref Range Status   05/02/2018 Negative  Final     Rubella IgG AB   Date Value Ref Range Status   10/09/2017 POSITIVE  Final     Comment:     TEST METHOD: Multiplex flow immunoassay     HBV S Ag   Date Value Ref Range Status   10/09/2017 NEG  Final     Comment:     Test Method: CMIA     Group B Strep Culture   Date Value Ref Range Status   04/17/2018 .  Final     HIV 1&2 ANTIGEN/ANTIBODY   Date Value Ref Range Status   10/09/2017 Nonreactive  Final     Comment:     Test Method: CMIA      Dating Information  No LMP recorded. Patient is pregnant. EDD: 05/14/2018, Alternate EDD Entry  Information for the patient's newborn:  Madi, Bajric Girl [[id number]]     Delivery Information  Girl [place]  Sex: female Gestational Age: [redacted]w[redacted]d MRN: [id number] PCP: Peter Garter, MD   Delivery Date/Time: 05/03/2018 8:20 PM   Time of Head Delivery: 05/03/2018  8:19 PM  Delivery Type: Vaginal, Spontaneous        Meconium at time of delivery: none  Delivery Location: labor flr    Labor Onset Date/Time:     Dilation Complete Date/Time: 05/03/2018 8:11 PM     Preterm labor: No Antenatal steroids: None Antibiotics received during labor: No      First Cervical ripening  date/time:       Cervical ripening Type:          Rupture Date: 05/03/2018 Rupture Time: 9:48 AM  Details:   Rupture Type: Artificial Color: Clear Amount:   Fluid odor:   Induction: Oxytocin  Induction start date/time2/07/2018 8:59 PM      Augmentation:        Labor complications: None     Providers    Delivering clinician:  Lynnae Sandhoff, CNM   Other personnel:   Provider Role   Alinda Deem Delivery Nurse   Geryl Rankins, RN Registered Nurse   Yvonne Kendall, DO Resident           Anesthesia Method: Epidural-   Analgesics:        Presentation: Vertex Position:        Prophylactic Maneuver: No     Shoulder Dystocia: No  Skin to Skin/Bonding: Skin to skin initiation date & time: 05/03/2018  8:20 PM  with: Mom           Resuscitation: Dry;Tactile Stimulation;Bulb Suctioning     Living Status: Living             APGARs Total Color Reflex irritability Breath Heart Rate Muscle Tone Assigned By   (greater than 7 no need for next measurement)   1 min 8  0  2  2  2  2   N. Turiano RN    5 min 9  1  2  2  2  2   N. Turiano RN    10 min                 15 min                 20 min                 25 min                 30 min                   Birth Weight:   Height:   Head Circumference:   Observed Anomalies:      Cord: 3 Vessels     Complications: Cord around         Cord around: neck     Cord tension: loose     Number of loops: 1       Interventions: reduced       Clamping Delayed: 1  Clamped Date/Time:05/03/2018  8:22 PM  Cord blood disposition: Lab     Gases sent: No       Stem cell collection -by MD-: No   Maternal Info:   Placenta Delivery Date/Time: 05/03/2018  8:27 PM     Removal: Spontaneous     Appearance: Intact       Disposition: discarded  Bonding:     Stages of Labor:          Stage One:   h   m          Stage Two:   h   m          Stage Three:   h   m  Episiotomy: None      Lacerations: None                                         Vaginal Hematoma:No.        Needle Count: Correct        Sponge Count: Correct      Procedures: None       Vaginal Delivery Blood Loss mL: 300         Birth weight- 2919 g     Intraprocedure I/O Totals     None           Delivery Narrative:    Amy Osborn is a 33 y.o. G2P1001, now P2, admitted at 307w3d gestation for IOL for IUGR.  Pregnancy complicated by IUGR, hx leep, tobacco use. Received M+P initially for pain control. Started on pitocin, which was up titrated. Received epidural, with good effect.     The patient progressed to fully dilated and began pushing.  There was delivery of a viable female infant in the vertex presentation  via normal spontaneous vaginal delivery weighing 2919 g with Apgars of 9 and 9.   Nuchal cord: Yes, loose   NICU in attendance: No   Spontaneous delivery of placenta intact: Yes   Lacerations: minor abrasions noted, no repairs required   Maternal condition: Good   Infant condition: Good    Started on pitocin @ 150 post delivery.     Lynnae SandhoffMary Jo Spallina, CNM  present for delivery.        Yancey FlemingsAllison Elbert Spickler, DO  Family Medicine, PGY-1  05/03/18 9:18 PM

## 2018-04-03 ENCOUNTER — Encounter: Payer: Self-pay | Admitting: Gastroenterology

## 2018-04-17 ENCOUNTER — Other Ambulatory Visit
Admission: RE | Admit: 2018-04-17 | Discharge: 2018-04-17 | Disposition: A | Discharge status: Home / Self Care | Payer: Medicaid Other | Source: Ambulatory Visit | Attending: Obstetrics and Gynecology | Admitting: Obstetrics and Gynecology

## 2018-04-17 DIAGNOSIS — Z112 Encounter for screening for other bacterial diseases: Secondary | ICD-10-CM | POA: Insufficient documentation

## 2018-04-18 LAB — VAGINITIS SCREEN: DNA PROBE: Vaginitis Screen:DNA Probe: POSITIVE — AB

## 2018-04-20 LAB — GROUP B STREP CULTURE: Group B Strep Culture: 0

## 2018-05-02 ENCOUNTER — Encounter: Payer: Self-pay | Admitting: Obstetrics and Gynecology

## 2018-05-02 ENCOUNTER — Inpatient Hospital Stay
Admission: AD | Admit: 2018-05-02 | Discharge: 2018-05-05 | DRG: 560 | Disposition: A | Discharge status: Home / Self Care | Payer: Medicaid Other | Source: Ambulatory Visit | Attending: Obstetrics and Gynecology | Admitting: Obstetrics and Gynecology

## 2018-05-02 DIAGNOSIS — Z3490 Encounter for supervision of normal pregnancy, unspecified, unspecified trimester: Secondary | ICD-10-CM

## 2018-05-02 DIAGNOSIS — O99334 Smoking (tobacco) complicating childbirth: Secondary | ICD-10-CM | POA: Diagnosis present

## 2018-05-02 DIAGNOSIS — O365990 Maternal care for other known or suspected poor fetal growth, unspecified trimester, not applicable or unspecified: Secondary | ICD-10-CM | POA: Diagnosis present

## 2018-05-02 DIAGNOSIS — O365930 Maternal care for other known or suspected poor fetal growth, third trimester, not applicable or unspecified: Principal | ICD-10-CM | POA: Diagnosis present

## 2018-05-02 DIAGNOSIS — Z3A38 38 weeks gestation of pregnancy: Secondary | ICD-10-CM

## 2018-05-02 DIAGNOSIS — F17210 Nicotine dependence, cigarettes, uncomplicated: Secondary | ICD-10-CM | POA: Diagnosis present

## 2018-05-02 LAB — TYPE AND SCREEN
ABO RH Blood Type: O POS
Antibody Screen: NEGATIVE

## 2018-05-02 LAB — CBC
Hematocrit: 35 % (ref 34–45)
Hemoglobin: 11.7 g/dL (ref 11.2–15.7)
MCH: 28 pg (ref 26–32)
MCHC: 34 g/dL (ref 32–36)
MCV: 82 fL (ref 79–95)
Platelets: 200 10*3/uL (ref 160–370)
RBC: 4.2 MIL/uL (ref 3.9–5.2)
RDW: 14 % (ref 11.7–14.4)
WBC: 17.6 10*3/uL — ABNORMAL HIGH (ref 4.0–10.0)

## 2018-05-02 MED ORDER — LACTATED RINGERS IV BOLUS *I*
1000.0000 mL | INTRAVENOUS | Status: DC | PRN
Start: 2018-05-02 — End: 2018-05-03
  Administered 2018-05-03 (×2): 1000 mL via INTRAVENOUS

## 2018-05-02 MED ORDER — DEXTROSE 5 % FLUSH FOR PUMPS *I*
0.0000 mL/h | INTRAVENOUS | Status: DC | PRN
Start: 2018-05-02 — End: 2018-05-03

## 2018-05-02 MED ORDER — LACTATED RINGERS IV SOLN *I*
150.0000 mL/h | INTRAVENOUS | Status: DC
Start: 2018-05-02 — End: 2018-05-03
  Administered 2018-05-02: 150 mL/h
  Administered 2018-05-02: 150 mL/h via INTRAVENOUS
  Administered 2018-05-03 (×11): 150 mL/h
  Administered 2018-05-03: 150 mL/h via INTRAVENOUS
  Administered 2018-05-03: 150 mL/h
  Administered 2018-05-03: 20 mL/h
  Administered 2018-05-03: 150 mL/h
  Administered 2018-05-03: 150 mL/h via INTRAVENOUS
  Administered 2018-05-03 (×7): 150 mL/h
  Administered 2018-05-03: 150 mL/h via INTRAVENOUS
  Administered 2018-05-03: 20 mL/h
  Administered 2018-05-03 (×5): 150 mL/h

## 2018-05-02 MED ORDER — ACETAMINOPHEN 325 MG PO TABS *I*
650.0000 mg | ORAL_TABLET | Freq: Once | ORAL | Status: AC
Start: 2018-05-02 — End: 2018-05-02
  Administered 2018-05-02: 650 mg via ORAL
  Filled 2018-05-02: qty 2

## 2018-05-02 MED ORDER — LIDOCAINE HCL (PF) 1 % IJ SOLN *I*
20.0000 mL | Freq: Once | INTRAMUSCULAR | Status: DC
Start: 2018-05-02 — End: 2018-05-03

## 2018-05-02 MED ORDER — OXYTOCIN 30 UNITS IN 500ML NS WRAPPED *I*
1.0000 m[IU]/min | INTRAMUSCULAR | Status: DC
Start: 2018-05-02 — End: 2018-05-03
  Administered 2018-05-02 (×2): 4 m[IU]/min
  Administered 2018-05-02 (×2): 2 m[IU]/min
  Administered 2018-05-02: 2 m[IU]/min via INTRAVENOUS
  Administered 2018-05-03: 10 m[IU]/min
  Administered 2018-05-03: 8 m[IU]/min
  Administered 2018-05-03: 6 m[IU]/min
  Administered 2018-05-03: 20 m[IU]/min
  Administered 2018-05-03: 24 m[IU]/min
  Administered 2018-05-03: 10 m[IU]/min
  Administered 2018-05-03: 22 m[IU]/min
  Administered 2018-05-03: 16 m[IU]/min
  Administered 2018-05-03: 14 m[IU]/min
  Administered 2018-05-03: 16 m[IU]/min
  Administered 2018-05-03: 12 m[IU]/min
  Administered 2018-05-03: 18 m[IU]/min
  Administered 2018-05-03: 26 m[IU]/min
  Administered 2018-05-03: 4 m[IU]/min
  Administered 2018-05-03: 24 m[IU]/min
  Administered 2018-05-03: 8 m[IU]/min
  Administered 2018-05-03: 2 m[IU]/min
  Administered 2018-05-03: 22 m[IU]/min
  Administered 2018-05-03: 8 m[IU]/min via INTRAVENOUS
  Administered 2018-05-03: 12 m[IU]/min
  Administered 2018-05-03: 18 m[IU]/min
  Administered 2018-05-03: 4 m[IU]/min
  Administered 2018-05-03: 6 m[IU]/min
  Administered 2018-05-03 (×2): 24 m[IU]/min
  Administered 2018-05-03: 20 m[IU]/min
  Administered 2018-05-03: 18 m[IU]/min
  Administered 2018-05-03: 28 m[IU]/min
  Administered 2018-05-03: 26 m[IU]/min
  Administered 2018-05-03: 10 m[IU]/min
  Administered 2018-05-03: 14 m[IU]/min
  Administered 2018-05-03: 18 m[IU]/min
  Administered 2018-05-03 (×2): 14 m[IU]/min
  Administered 2018-05-03: 26 m[IU]/min
  Administered 2018-05-03 (×2): 8 m[IU]/min
  Administered 2018-05-03 (×2): 12 m[IU]/min
  Administered 2018-05-03: 20 m[IU]/min
  Administered 2018-05-03: 6 m[IU]/min
  Administered 2018-05-03: 10 m[IU]/min
  Administered 2018-05-03: 2 m[IU]/min
  Administered 2018-05-03: 24 m[IU]/min
  Administered 2018-05-03: 22 m[IU]/min
  Administered 2018-05-03: 8 m[IU]/min
  Administered 2018-05-03: 20 m[IU]/min
  Administered 2018-05-03: 16 m[IU]/min
  Administered 2018-05-03: 22 m[IU]/min
  Administered 2018-05-03: 16 m[IU]/min
  Administered 2018-05-03: 20 m[IU]/min
  Filled 2018-05-02: qty 500

## 2018-05-02 MED ORDER — SODIUM CHLORIDE 0.9 % FLUSH FOR PUMPS *I*
0.0000 mL/h | INTRAVENOUS | Status: DC | PRN
Start: 2018-05-02 — End: 2018-05-03

## 2018-05-02 MED ORDER — OXYTOCIN 30 UNITS/500 ML NS *POSTPARTUM* *I*
100.0000 m[IU]/min | INTRAMUSCULAR | Status: AC
Start: 2018-05-02 — End: 2018-05-02

## 2018-05-02 NOTE — H&P (Signed)
OBSTETRICS ADMISSION HISTORY & PHYSICAL      Primary OB-GYN: [facility]    Reason for Admission (Chief Complaint): IOL    HPI     Amy Osborn is a 33 y.o. G2P1001 at 65w2dby LMP c/w 9w ultrasound with pregnancy complicated by risks outlined below who presents for IOL for IUGR. Denies VB, LOF, DFM, regular CTX. Feeling well this evening.       Pregnancy Risks     IUGR  - EFW 11%, AC <3%  - poor interval growth    Hx LEEP    Tobacco use      Past Medical History     Past Medical History:   Diagnosis Date    Anxiety     Complication of anesthesia     n/v after anesthetics per pt    Kidney stones     Kidney stones     Mood disorder     Nephrolithiasis        Past Surgical History     Past Surgical History:   Procedure Laterality Date    LEEP      LITHOTRIPSY         Obstetrical History     OB History   Gravida Para Term Preterm AB Living   '2 1 1     1   ' SAB TAB Ectopic Multiple Live Births           1      # Outcome Date GA Lbr Len/2nd Weight Sex Delivery Anes PTL Lv   2 Current            1 Term      Vag-Spont   LIV       Allergies   No Known Allergies (drug, envir, food or latex)    Current Home Medications     Prior to Admission medications    Medication Sig Start Date End Date Taking? Authorizing Provider   fluticasone (FLONASE) 50 MCG/ACT nasal spray 1 spray by Nasal route daily 01/01/15 06/30/15  AGaylan Gerold MBBS   albuterol HFA 108 (90 BASE) MCG/ACT inhaler Inhale 1-2 puffs into the lungs every 6 hours as needed   Shake well before each use. 01/01/15   AGaylan Gerold MBBS       GYN History, Social History, and Family History reviewed and updated in eRecord.      Review of Systems     Constitutional: Negative for fever or fatigue.  Psych: Denies depressive symptoms or anxiety.  Cardiovascular: Denies chest pain.  Respiratory: Denies shortness of breath.  Gastrointestinal: Denies nausea/vomiting.  Denies abdominal pain.  Musculoskeletal: Denies back pain.  Skin/Extremities: Denies peripheral edema.  Neurologic:  Denies headaches or vision changes.  Genitourinary: Denies dysuria. Denies vaginal bleeding. Denies leakage of fluid.      Prenatal Labs     No results for input(s): WBC, HGB, HCT, PLT in the last 168 hours.  No results for input(s): NA, K, CL, CO2, UN, CREAT, GFRC, GLU in the last 168 hours. No results for input(s): LD, URIC, ALT, AST, ALK, TB in the last 168 hours.   No results for input(s): UTPR, UCRR in the last 168 hours.    Urine spot P/C ratio:  No results for input(s): TPCREATRATIO in the last 8760 hours.          Lab results: 04/17/18  1135 10/17/17  1519 10/09/17  1358   ABO RH Blood Type  --   --  O RH POS  Rubella IgG AB  --   --  POSITIVE   Group B Strep Culture .  --   --    Syphilis Screen  --   --  Neg   HIV 1&2 ANTIGEN/ANTIBODY  --   --  Nonreactive   HBV S Ag  --   --  NEG   N. gonorrhoeae DNA Amplification  --  .  --    Chlamydia Plasmid DNA Amplification  --  .  --         Lab results: 02/17/18  1012   Glucose,50gm 1HR 82      No results for input(s): GL0, GL1H, GL2H, GL3H in the last 8760 hours.        Prenatal Ultrasound Review (Pertinent Findings):  Normal anatomic [place]  Normal growth and fluid at 33w      Physical Exam     Vitals:    05/02/18 1835   BP: 117/66   Pulse: (!) 114   Resp: 18   SpO2: 98%   Weight: 88.9 kg (196 lb)   Height: 1.753 m ('5\' 9"' )       General Appearance: healthy, alert and no distress  Mental Status: Alert and oriented x 3  Mood/Affect: appropriate  Skin: warm, dry  HEENT: Normocephalic, atraumatic.  Cardiovascular: Regular rate and rhythm with no murmurs  Respiratory: Clear to auscultate, normal respiratory effort  Abdomen: Soft, gravid, non-tender.   Uterus: Gravid. 41 weeks size. Non-tender to palpation.  Neurological: No gross abnormality  Extremities: No lower extremity edema    Cervical Exam: 2/80/-3 in office today    Estimated Fetal Weight: 3500 grams by Curlene Labrum maneuvers    Presentation: cephalic by ultrasound    Placental location: anterior    Fetal  Monitoring:  Baseline: 135 bpm  Variability: Moderate (6-25 BPM)  Accelerations: Yes 15X15  Decelerations: None  Category: 1  Toco: None      Assessment & Plan     Amy Osborn is a 33 y.o. G2P1001 at 69w2dwith pregnancy complicated by risks outlined above admitted for IOL for IUGR.    Admit to L&D   - Insert IV   - CBC, T&S, and Syphilis screen sent on admission.   - Cervix: 2/80 in clinic / Membranes: intact   - Presentation: cephalic / EFW: 39147grams by Leopolds   - Category 1 fetal heart tracing.  continuous EFM ordered.   - Due to lack of risk factors, risk of PPH is: Low (Type & Screen)    Labor Plan   - IOL with Pitocin   - Anticipate vaginal delivery    - Maternal and fetal risks and benefits of delivery between 36 0/7 and 38 6/7 weeks were discussed with the mother. Patient understands the risks and benefits of delivery at < [redacted] weeks gestation and that the indication(s) of  Poor fetal growth makes delivery at this time appropriate.     Postpartum planning   - Rh positive / Rubella immune /HIV negative / GBS negative   - Infant: Expecting female infant.   - Feeding: breast   - PPBC: per attending   - Immunizations: s/p tdap, declined flu    D/w Dr. LRicardo Jericho MD, MPH  Maternal Child Health Fellow   [facility]

## 2018-05-02 NOTE — Progress Notes (Signed)
Attending    Amy Osborn is a 33 yo P1 at 38+ weeks who had an ultrasound today that showed:  1. EFW 11th percentile  2. Poor interval growth  180 gm over 14 days  3. AC <3rd percentile  4. Normal AFI  An NST was obtained and reactive.    She is a heavy smoker.  Hx of anxiety and LEEP.    She was advised IOL due to above.  Cx was 2/80%/-1    FH cata 1 here  toco irreg contractions    A: IOL for poor interval growth , small AC (impending IUGR) at 38+ weeks in a heavy smoker - thus high risk for FDIU    Plan: pitocin induction of labor - AROM with regular pattern  Discussed COOK and she declined  Has hx of rapid IOL for her first baby who was over 9 pounds  Anticipate SVD.    Discussed the risks of delivery before 39 weeks  - prematurity - but that the risks of continued pregnancy in the setting of poor interval growth with small AC in a heavy smoker outweigh the prematurity risks. She and her family understand and agree with IOL and associated risks.    Bartholome Bill, MD

## 2018-05-03 ENCOUNTER — Inpatient Hospital Stay: Payer: Medicaid Other | Admitting: Anesthesiology

## 2018-05-03 LAB — SYPHILIS SCREEN
Syphilis Screen: NEGATIVE
Syphilis Status: NONREACTIVE

## 2018-05-03 MED ORDER — BENZOCAINE-MENTHOL 20-0.5 % EX AERO *I*
INHALATION_SPRAY | CUTANEOUS | Status: DC | PRN
Start: 2018-05-03 — End: 2018-05-05

## 2018-05-03 MED ORDER — POLYETHYLENE GLYCOL 3350 PO PACK 17 GM *I*
17.0000 g | PACK | Freq: Every day | ORAL | Status: DC | PRN
Start: 2018-05-03 — End: 2018-05-05

## 2018-05-03 MED ORDER — LIDOCAINE HCL 1 % IJ SOLN *I*
INTRAMUSCULAR | Status: DC | PRN
Start: 2018-05-03 — End: 2018-05-03
  Administered 2018-05-03: 3 mL via SUBCUTANEOUS

## 2018-05-03 MED ORDER — DOCUSATE SODIUM 100 MG PO CAPS *I*
100.0000 mg | ORAL_CAPSULE | Freq: Two times a day (BID) | ORAL | Status: DC | PRN
Start: 2018-05-03 — End: 2018-05-05

## 2018-05-03 MED ORDER — ACETAMINOPHEN 325 MG PO TABS *I*
650.0000 mg | ORAL_TABLET | Freq: Four times a day (QID) | ORAL | Status: DC
Start: 2018-05-03 — End: 2018-05-05
  Administered 2018-05-03 – 2018-05-05 (×6): 650 mg via ORAL
  Filled 2018-05-03 (×5): qty 2

## 2018-05-03 MED ORDER — ONDANSETRON HCL 4 MG PO TABS *I*
4.0000 mg | ORAL_TABLET | Freq: Three times a day (TID) | ORAL | Status: DC | PRN
Start: 2018-05-03 — End: 2018-05-05

## 2018-05-03 MED ORDER — IBUPROFEN 600 MG PO TABS *I*
ORAL_TABLET | ORAL | Status: AC
Start: 2018-05-03 — End: 2018-05-03
  Administered 2018-05-03: 600 mg via ORAL
  Filled 2018-05-03: qty 1

## 2018-05-03 MED ORDER — DIPHENHYDRAMINE HCL 25 MG ORAL SOLID *WRAPPED*
25.0000 mg | Freq: Every evening | ORAL | Status: DC | PRN
Start: 2018-05-03 — End: 2018-05-05

## 2018-05-03 MED ORDER — LIDOCAINE-EPINEPHRINE (PF) 2 %-1:200000 IJ SOLN *I*
INTRAMUSCULAR | Status: DC | PRN
Start: 2018-05-03 — End: 2018-05-03
  Administered 2018-05-03: 3 mL via EPIDURAL

## 2018-05-03 MED ORDER — MORPHINE SULFATE 10 MG/ML IV SOLN *WRAPPED*
10.0000 mg | Freq: Once | INTRAVENOUS | Status: AC
Start: 2018-05-03 — End: 2018-05-03
  Administered 2018-05-03: 10 mg via INTRAMUSCULAR
  Filled 2018-05-03: qty 1

## 2018-05-03 MED ORDER — ACETAMINOPHEN 325 MG PO TABS *I*
650.0000 mg | ORAL_TABLET | Freq: Once | ORAL | Status: AC
Start: 2018-05-03 — End: 2018-05-03
  Administered 2018-05-03: 650 mg via ORAL
  Filled 2018-05-03: qty 2

## 2018-05-03 MED ORDER — FENTANYL CITRATE 50 MCG/ML IJ SOLN *WRAPPED*
INTRAMUSCULAR | Status: DC | PRN
Start: 2018-05-03 — End: 2018-05-03
  Administered 2018-05-03: 100 ug via EPIDURAL

## 2018-05-03 MED ORDER — FENTANYL 2 MCG/ML AND 0.125% BUPIVACAINE *I*
10.0000 mL/h | INTRAMUSCULAR | Status: DC | PRN
Start: 2018-05-03 — End: 2018-05-03
  Administered 2018-05-03: 10 mL/h via EPIDURAL

## 2018-05-03 MED ORDER — FENTANYL CITRATE 50 MCG/ML IJ SOLN *WRAPPED*
INTRAMUSCULAR | Status: AC
Start: 2018-05-03 — End: 2018-05-03
  Filled 2018-05-03: qty 2

## 2018-05-03 MED ORDER — ALUM & MAG HYDROXIDE-SIMETH 200-200-20 MG/5ML PO SUSP *I*
30.0000 mL | Freq: Four times a day (QID) | ORAL | Status: DC | PRN
Start: 2018-05-03 — End: 2018-05-05

## 2018-05-03 MED ORDER — FENTANYL 2 MCG/ML AND 0.125% BUPIVACAINE *I*
INTRAMUSCULAR | Status: AC
Start: 2018-05-03 — End: 2018-05-03
  Filled 2018-05-03: qty 150

## 2018-05-03 MED ORDER — LACTATED RINGERS IV SOLN *I*
50.0000 mL/h | INTRAVENOUS | Status: AC
Start: 2018-05-03 — End: 2018-05-04
  Administered 2018-05-03 (×2): 50 mL/h
  Administered 2018-05-03: 50 mL/h via INTRAVENOUS

## 2018-05-03 MED ORDER — LIDOCAINE HCL (PF) 1 % IJ SOLN *I*
30.0000 mL | INTRAMUSCULAR | Status: DC | PRN
Start: 2018-05-03 — End: 2018-05-05

## 2018-05-03 MED ORDER — LIDOCAINE HCL 1 % IJ SOLN *I*
INTRAMUSCULAR | Status: DC | PRN
Start: 2018-05-03 — End: 2018-05-03
  Administered 2018-05-03: 3 mL via EPIDURAL
  Administered 2018-05-03: 2 mL via EPIDURAL

## 2018-05-03 MED ORDER — IBUPROFEN 600 MG PO TABS *I*
600.0000 mg | ORAL_TABLET | Freq: Four times a day (QID) | ORAL | Status: DC
Start: 2018-05-03 — End: 2018-05-05
  Administered 2018-05-04 – 2018-05-05 (×5): 600 mg via ORAL
  Filled 2018-05-03 (×5): qty 1

## 2018-05-03 MED ORDER — PROMETHAZINE HCL 25 MG/ML IJ SOLN *I*
25.0000 mg | Freq: Once | INTRAMUSCULAR | Status: AC
Start: 2018-05-03 — End: 2018-05-03
  Administered 2018-05-03: 25 mg via INTRAMUSCULAR
  Filled 2018-05-03: qty 1

## 2018-05-03 MED ORDER — DEXTROSE 5 % FLUSH FOR PUMPS *I*
0.0000 mL/h | INTRAVENOUS | Status: DC | PRN
Start: 2018-05-03 — End: 2018-05-05

## 2018-05-03 MED ORDER — OXYTOCIN 30 UNITS/500 ML NS *POSTPARTUM* *I*
150.0000 m[IU]/min | INTRAMUSCULAR | Status: AC
Start: 2018-05-03 — End: 2018-05-04
  Administered 2018-05-03: 150 m[IU]/min via INTRAVENOUS
  Administered 2018-05-03 (×2): 150 m[IU]/min
  Administered 2018-05-03: 150 m[IU]/min via INTRAVENOUS
  Administered 2018-05-03 – 2018-05-04 (×3): 150 m[IU]/min
  Filled 2018-05-03: qty 500

## 2018-05-03 MED ORDER — HYDROCORTISONE ACE-PRAMOXINE 2.5-1 % RE CREA *I*
TOPICAL_CREAM | CUTANEOUS | Status: DC | PRN
Start: 2018-05-03 — End: 2018-05-05
  Filled 2018-05-03: qty 30

## 2018-05-03 MED ORDER — CALCIUM CARBONATE ANTACID 500 MG PO CHEW *I*
1000.0000 mg | CHEWABLE_TABLET | Freq: Three times a day (TID) | ORAL | Status: DC | PRN
Start: 2018-05-03 — End: 2018-05-05

## 2018-05-03 MED ORDER — SODIUM CHLORIDE 0.9 % FLUSH FOR PUMPS *I*
0.0000 mL/h | INTRAVENOUS | Status: DC | PRN
Start: 2018-05-03 — End: 2018-05-05

## 2018-05-03 MED ORDER — ACETAMINOPHEN 325 MG PO TABS *I*
ORAL_TABLET | ORAL | Status: DC
Start: 2018-05-03 — End: 2018-05-03
  Filled 2018-05-03: qty 2

## 2018-05-03 MED ORDER — NICOTINE 14 MG/24HR TD PT24 *I*
1.0000 | MEDICATED_PATCH | Freq: Every day | TRANSDERMAL | Status: DC
Start: 2018-05-03 — End: 2018-05-03
  Administered 2018-05-03: 1 via TRANSDERMAL
  Filled 2018-05-03: qty 1

## 2018-05-03 MED ORDER — LIDOCAINE-EPINEPHRINE (PF) 1.5 %-1:200000 IJ SOLN *I*
INTRAMUSCULAR | Status: DC | PRN
Start: 2018-05-03 — End: 2018-05-03
  Administered 2018-05-03: 2 mL via EPIDURAL
  Administered 2018-05-03 (×2): 3 mL via EPIDURAL
  Administered 2018-05-03: 2 mL via EPIDURAL

## 2018-05-03 MED ORDER — SODIUM CHLORIDE 0.9 % 100 ML IV SOLN *I*
12.5000 mg | Freq: Four times a day (QID) | INTRAVENOUS | Status: DC | PRN
Start: 2018-05-03 — End: 2018-05-05

## 2018-05-03 NOTE — Progress Notes (Signed)
Pt encouraged to ambulate with telemetry.  Instructions given to stay on the 3rd floor so RN can find her if she goes "out of range."

## 2018-05-03 NOTE — Plan of Care (Signed)
Problem: Fetal Oxygenation  Goal: Maintain umbilical cord blood flow  Outcome: Completed or Resolved  Goal: Maintain adequate maternal oxygenation  Outcome: Completed or Resolved     Problem: Uteroplacental perfusion  Goal: Maintain adequate uteroplacental perfusion  Outcome: Completed or Resolved  Goal: Identify the presence of a labor contraction pattern  Outcome: Completed or Resolved  Goal: Maintain patient safety in the presence of vaginal bleeding.  Outcome: Completed or Resolved     Problem: Fetal Oxygenation  Goal: Maximize maternal circulation during the intrapartum phase  Outcome: Completed or Resolved

## 2018-05-03 NOTE — Progress Notes (Signed)
OBSTETRICS PROGRESS NOTE       Subjective     Patient reports that she is doing well. Not feeling as many contractions as the monitor is showing. No other complaints.     Objective     Vitals:    05/02/18 2242 05/03/18 0011 05/03/18 0452 05/03/18 0729   BP: 100/57 (!) 97/49 106/58 94/50   Pulse: 76 67 58 80   Resp: 18 18     Temp: 36 C (96.8 F) 36.3 C (97.3 F) 35.7 C (96.3 F) 35.8 C (96.4 F)   TempSrc: Axillary Oral  Oral   SpO2:       Weight:       Height:           Most recent cervical exam:     2/80 in office 2/5 AM       Membranes:   Membrane Status: Intact           Fetal Monitoring:  Baseline:  130 bpm  Variability: minimal  Accelerations: present  Decelerations: rare, sharp decel  Category: 2  Toco: irregular      Assessment & Plan     Amy Osborn is a 33 y.o. G2P1001 at [redacted]w[redacted]d admitted for IOL for IUGR    1. FHT: Category I. Interventions: cefm.  2. Labor assessment: latent labor, pitocin @ 28  3. Pain management: s/p M+P [0206]  4. GBS status: negative.   5. Labor Risks:     IUGR  - EFW 11%, AC <3%  - poor interval growth    Hx LEEP    Tobacco use      Amy Rew SINGH Bacilio Abascal, DO  FAMILY MEDICINE PGY-1  05/03/18 8:34 AM

## 2018-05-03 NOTE — Progress Notes (Signed)
Writer went down to pt's room at 1610 to adjust the monitor because the telemetry had gone "out of range."  RN was unable to find the pt despite best efforts for the next 30 minutes.  Pt had unintentionally gone to visit a "girlfiend" on [place] [address] who had just had a baby.  RN was able to get the pt back on the EFM at 1637 and FHR was 130.

## 2018-05-03 NOTE — Progress Notes (Signed)
OB Intrapartum Note:    Subjective: Pt done with breakfast. Ready for "next step". Husband at side.    Vitals:   Vitals:    05/03/18 0946   BP: 107/60   Pulse: 99   Resp:    Temp:    Weight:    Height:        Pelvic Exam:   Dilation: 4cm   Effacement: 80%   Station: -1    Membranes: ruptured, clear fluid    Electronic Fetal Monitoring:  Baseline  140 bpm         Variability: Moderate (6-25 BPM)  Pattern: Accelerations  Category: Category I  Toco: irregular  Labor Assessment: early    A/P:  33 y.o. G2P1001 at [redacted]w[redacted]d admitted for IOL.    1) FHT: Cat I  2) Pain: comfortable at present  3) GBS: negative  4) Labor: IV pitocin restarted. Turned off for "wash out". AROM.  5) Anticipate SVD.    Lynnae Sandhoff, CNM      D/w resident team

## 2018-05-03 NOTE — Progress Notes (Signed)
Report given to Michelle, RN

## 2018-05-03 NOTE — Progress Notes (Signed)
Amy Osborn  32 y.o.  G2P1001  History smoking, induced for IUGR  Doctor CNM Spaullina  Delivery SVD  Date 2/6  Time 2020  Perineum intact  Fundus @ U  Lochia rubra  EBL 300  Voids-1 550 ml   Pitocin @ 126ml/hr &LR @ 21ml/hr  Labs: Blood O+/HIV neg/HBV neg/Rubella immune/ GBS neg/ Syphilis neg  Flu:   TdaP:     Pt transferred via wheelchair to room 324 at 2230.  Verbal report and transfer of care to Kavin Leech RN at this time.    Signed by Alinda Deem

## 2018-05-03 NOTE — Progress Notes (Signed)
OBSTETRICS PROGRESS NOTE       Subjective     In to see patient at start of shift. Patient doing well. No complaints or concerns. Plan to get epidural.       Objective     Vitals:    05/03/18 1845 05/03/18 1846 05/03/18 1848 05/03/18 1850   BP:  (!) 98/44 (!) 102/41 (!) 103/44   Pulse: 74 73 69 69   Resp:       Temp:       TempSrc:       SpO2: 100%   100%   Weight:       Height:           Most recent cervical exam:   OB Examiner: Williams MD (05/03/18 1651)  Dilation: 4  Effacement (%): 90  Station: -1    Membranes:   Membrane Status: Artificial   Color: Clear   Rupture Date: 05/03/18  Rupture Time: 0948    Fetal Monitoring:  Baseline: 130 bpm  Variability: moderate  Accelerations: present  Decelerations: no obvious decels  Category: 1   Toco: q 3 min      Assessment & Plan     Amy Osborn is a 33 y.o. G2P1001 at [redacted]w[redacted]d admitted for IOL for IUGR    1. FHT: Category I. Interventions: cefm.  2. Labor assessment: latent labor, pit @ 20  3. Pain management: s/p M+P, plan for epidural  4. GBS status: negative.  5. Labor Risks:     - IUGR  - EFW 11%, AC <3%  - poor interval growth    -Hx LEEP    - Tobacco use    Yancey Flemings, DO  Family Medicine, PGY-1  05/03/18 6:58 PM

## 2018-05-03 NOTE — Anesthesia Procedure Notes (Addendum)
---------------------------------------------------------------------------------------------------------------------------------------    NEURAXIAL BLOCK PLACEMENT  Labor Epidural    Date of Procedure: 05/03/2018 6:17 PM    Patient Location:  L&D    Reason for Block: labor epidural    CONSENT AND TIMEOUT     Consent:  Obtained per policy    Timeout: patient identified (name/DOB) , operative procedure/site/side verified by patient or family , operative procedure/side/site verified against surgical consent and operative procedure/side/site verified against surgical schedule  METHOD:    Patient Position: sitting    Monitoring: blood pressure and continuous pulse oximetry    Sedation Used: no            For medications used, please see MAR    Level of Sedation: none      Prep: povidone-iodine and patient draped      Successful Approach: midline    Successful Location: L3-4    Technique: LOR air    Attempts (Skin Punctures):  1     Lot Number: [id number]; Expiration Date: 2019-06-26  NEEDLE AND CATHETER:  Needle:    Needle Type: Tuohy    Needle Gauge:  18 G    Needle Length:  9 cm    Needle Insertion Depth:  6  Catheter:     Catheter Size: 20 gauge    Catheter Type: closed tip (multi-orifice)  Tunneled: No      Catheter in Space:  6    Catheter at Skin: 10  TESTING AND VALIDATION:    Catheter Aspirate: negative      Test Dose Response: negative  OBSERVATIONS:    Block Completion:  Successfully completed  Wet Tap: No      Paresthesia: none  Neuraxial Blood: blood not aspirated      Patient Reaction to Block: tolerated procedure well, pain relief, vitals remained stable and fetal stability  STAFF     Performed by: extender under direct supervision    Attending Attestation: I was present for the entire procedure     Attending: Stann Mainland, MD  Extender: Wayna Chalet, CRNA  ----------------------------------------------------------------------------------------------------------------------------------------

## 2018-05-03 NOTE — Anesthesia Preprocedure Evaluation (Addendum)
Anesthesia Pre-operative History and Physical for Amy Osborn    Highlighted Issues for this Procedure:  For labor epidural      .  .  Anesthesia Evaluation Information Source: patient, records     ANESTHESIA HISTORY    + history of anesthetic complications        HEENT     Denies HEENT issues PULMONARY    + Smoker          tobacco currently    CARDIOVASCULAR     Denies cardiovascular issues    GI/HEPATIC/RENAL  Last PO Intake: >8hr before procedure    + Renal Issues          hx of kidney stones NEURO/PSYCH     Denies neuro/psych issues    ENDO/OTHER     Denies endo issues    HEMATOLOGIC     Denies hematologic issues         Physical Exam    Airway            Mouth opening: normal  Dental   Normal Exam   Cardiovascular  Normal Exam        General Survey    Normal Exam   Pulmonary   Normal Exam    Mental Status   Normal Exam       ________________________________________________________________________  PLAN  ASA Score  2  Anesthetic Plan epidural    ; Line ( use current access); Pain (caudal/epidural)    Informed Consent     Risks:        Risks discussed were commensurate with the plan listed above with the following specific points: headache, failed block, infection and hypotension, Damage to: allergic Rx and unexpected serious injury.    Anesthetic Consent:         Anesthetic plan (and risks as noted above) were discussed with patient and spouse    Plan also discussed with team members including:       CRNA and surgeon    Attending Attestation:  As the primary attending anesthesiologist, I attest that the patient or proxy understands and accepts the risks and benefits of the anesthesia plan. I also attest that I have personally performed a pre-anesthetic examination and evaluation, and prescribed the anesthetic plan for this particular location within 48 hours prior to the anesthetic as documented. Stann Mainland, MD 10:10 PM

## 2018-05-03 NOTE — Progress Notes (Signed)
OBSTETRICS PROGRESS NOTE       Subjective     Reports that she is starting to feel more uncomfortable. Feels like baby is moving more. She is unsure if or how often she is having contractions.       Objective     Vitals:    05/02/18 1835 05/02/18 2059 05/02/18 2200 05/02/18 2242   BP: 117/66 104/57  100/57   Pulse: (!) 114 77  76   Resp: 18 18 18 18    Temp: 36.4 C (97.5 F)   36 C (96.8 F)   TempSrc: Axillary   Axillary   SpO2: 98%      Weight: 88.9 kg (196 lb)      Height: 1.753 m (5\' 9" )          Most recent cervical exam:       2/80/-1 by Dr. Jaynie Collins @ 21:30    Membranes:   Membrane Status: Intact           Fetal Monitoring:  Baseline: 130 bpm  Variability: moderate  Accelerations: present  Decelerations: discontinuous tracing, no obvious decels  Category: 1  Toco: irregular      Assessment & Plan     Shonice is a 33 y.o. G2P1001 at [redacted]w[redacted]d admitted for IOL for poor interval growth    1. FHT: Category I. Interventions: cefm  2. Labor assessment:  Pitocin @ 42ml/hr, declined COOK  3. Pain management: received tylenol x1  4. GBS status: negative.  5. Labor Risks:   - poor interval growth  - heavy tobacco use  - Hx Leep     Yancey Flemings, DO  Family Medicine, PGY-1  05/03/18 12:18 AM

## 2018-05-03 NOTE — Progress Notes (Signed)
OBSTETRICS PROGRESS NOTE       Subjective     Patient reports that she is comfortable. No pain at this time. Not feeling contractions yet. No other concerns or complaints.       Objective     Vitals:    05/02/18 2200 05/02/18 2242 05/03/18 0011 05/03/18 0452   BP:  100/57 (!) 97/49 106/58   Pulse:  76 67 58   Resp: 18 18 18     Temp:  36 C (96.8 F) 36.3 C (97.3 F) 35.7 C (96.3 F)   TempSrc:  Axillary Oral    SpO2:       Weight:       Height:           Most recent cervical exam:     2/80 in office 2/5 AM       Membranes:   Membrane Status: Intact           Fetal Monitoring:  Baseline:  130 bpm  Variability: moderate  Accelerations: present  Decelerations: rare, sharp decel  Category: 1  Toco: irregular      Assessment & Plan     Amy Osborn is a 33 y.o. G2P1001 at [redacted]w[redacted]d admitted for IOL for IUGR    1. FHT: Category I. Interventions: cefm.  2. Labor assessment: latent labor, pitocin @ 22  3. Pain management: s/p M+P [0206]  4. GBS status: negative.   5. Labor Risks:     IUGR  - EFW 11%, AC <3%  - poor interval growth    Hx LEEP    Tobacco use        Yancey Flemings, DO  Family Medicine, PGY-1  05/03/18 6:23 AM

## 2018-05-03 NOTE — Progress Notes (Signed)
OBSTETRICS PROGRESS NOTE       Subjective     Still feel comfortable.     Objective     Vitals:    05/03/18 0452 05/03/18 0729 05/03/18 0945 05/03/18 0946   BP: 106/58 94/50  107/60   Pulse: 58 80  99   Resp:       Temp: 35.7 C (96.3 F) 35.8 C (96.4 F) 36.1 C (97 F)    TempSrc:  Oral Oral    SpO2:       Weight:       Height:           Most recent cervical exam:   OB Examiner: Spallina CNM (05/03/18 0947) 2/80 in office 2/5 AM  Dilation: 4  Effacement (%): 90  Station: -1    Membranes:   Membrane Status: Artificial   Color: Clear   Rupture Date: 05/03/18  Rupture Time: 0948    Fetal Monitoring:  Baseline:  130 bpm  Variability: moderate  Accelerations: present  Decelerations: rare, sharp decel  Category: 1  Toco: irr      Assessment & Plan     Amy Osborn is a 33 y.o. G2P1001 at [redacted]w[redacted]d admitted for IOL for IUGR    1. FHT: Category I. Interventions: cefm.  2. Labor assessment: latent labor, pitocin @ 4  3. Pain management: s/p M+P [0206]  4. GBS status: negative.   5. Labor Risks:     IUGR  - EFW 11%, AC <3%  - poor interval growth    Hx LEEP    Tobacco use      Amy Yankey SINGH Zeb Rawl, DO  FAMILY MEDICINE PGY-1  05/03/18 10:58 AM

## 2018-05-03 NOTE — Progress Notes (Signed)
OBSTETRICS PROGRESS NOTE       Subjective     In to meet patient at beginning of shift. Patient reports increasing strength of her contractions after pit washout. Anticipates wanting an epidural soon but declines one at this point.    Objective     Vitals:    05/03/18 0946 05/03/18 1038 05/03/18 1146 05/03/18 1220   BP: 107/60 (!) 89/45 99/56 (!) 93/47   Pulse: 99 74 70 68   Resp:    16   Temp:       TempSrc:       SpO2:       Weight:       Height:           Most recent cervical exam:   OB Examiner: Spallina CNM (05/03/18 0947)   Dilation: 4  Effacement (%): 90  Station: -1    Membranes:   Membrane Status: Artificial   Color: Clear   Rupture Date: 05/03/18  Rupture Time: 0948    Fetal Monitoring:  Baseline:  130 bpm  Variability: moderate  Accelerations: present  Decelerations: rare shallow variable with quick recovery and accels before and after  Category: II  Toco: q74m      Assessment & Plan     Amy Osborn is a 33 y.o. G2P1001 at [redacted]w[redacted]d admitted for IOL for IUGR    1. FHT: Category II. Interventions: CEFM  2. Labor assessment: latent labor, s/p pit washout, pit restarted at 100, now at 8, plan to continue to uptitrate pitocine as appropriate  3. Pain management: s/p M+P [0206]  4. GBS status: negative.   5. Labor Risks:     IUGR  - EFW 11%, AC <3%  - poor interval growth    Hx LEEP    Tobacco use      Fredricka Bonine, MD  OB/GYN Resident, PGY-1  Pager [phone]

## 2018-05-03 NOTE — Progress Notes (Signed)
OBSTETRICS PROGRESS NOTE       Subjective     Strip review    Objective     Vitals:    05/03/18 1146 05/03/18 1220 05/03/18 1329 05/03/18 1501   BP: 99/56 (!) 93/47 (!) 87/42 96/51   Pulse: 70 68 70 64   Resp:  16     Temp:   35.9 C (96.6 F) 36.5 C (97.7 F)   TempSrc:   Oral Oral   SpO2:       Weight:       Height:           Most recent cervical exam:   OB Examiner: Spallina CNM (05/03/18 0947)   Dilation: 4  Effacement (%): 90  Station: -1    Membranes:   Membrane Status: Artificial   Color: Clear   Rupture Date: 05/03/18  Rupture Time: 0948    Fetal Monitoring:  Baseline:  130 bpm  Variability: moderate  Accelerations: present  Decelerations: possible rare shallow late  Category: II  Toco: q62m      Assessment & Plan     Amy Osborn is a 33 y.o. G2P1001 at [redacted]w[redacted]d admitted for IOL for IUGR    1. FHT: Category II. Interventions: CEFM, repositioning PRN  2. Labor assessment: latent labor, s/p pit washout, pit restarted at 100, now at 14, plan to continue to uptitrate pitocine as appropriate  3. Pain management: s/p M+P [0206]  4. GBS status: negative.   5. Labor Risks:     IUGR  - EFW 11%, AC <3%  - poor interval growth    Hx LEEP    Tobacco use    Fredricka Bonine, MD  OB/GYN Resident, PGY-1  Pager [phone]

## 2018-05-03 NOTE — Progress Notes (Signed)
Admission Note:  Patient admitted from labor floor via wheelchair and safely transferred to bed in Room 324.  Report received from [facility] and care assumed by Clinical research associate.    Oriented patient to the floor and discussed safety of newborn, including use of bulb syringe and placing baby in the crib.  Patient verbalized understanding, and had no further questions at this time.     Louanne Belton RN

## 2018-05-03 NOTE — Progress Notes (Signed)
OBSTETRICS PROGRESS NOTE       Subjective     Patient more comfortable with M&P.        Objective     Vitals:    05/02/18 2059 05/02/18 2200 05/02/18 2242 05/03/18 0011   BP: 104/57  100/57 (!) 97/49   Pulse: 77  76 67   Resp: 18 18 18 18    Temp:   36 C (96.8 F) 36.3 C (97.3 F)   TempSrc:   Axillary Oral   SpO2:       Weight:       Height:           Most recent cervical exam:   2/80 in office 2/5 AM    Membranes:   Membrane Status: Intact    Fetal Monitoring:  Baseline: 130 bpm  Variability: moderate  Accelerations: present  Decelerations: None  Category: 1  Toco: Q5 min      Assessment & Plan     Amy Osborn is a 33 y.o. G2P1001 at [redacted]w[redacted]d admitted for IOL for IUGR.    1. FHT: Category I. Interventions: cEFM.  2. Labor assessment: Latent labor. Pitocin started 2059, now at 16.   3. Pain management: M&P 0206.  4. GBS status: negative.   5. Labor Risks:     IUGR  - EFW 11%, AC <3%  - poor interval growth    Hx LEEP    Tobacco use    Jaymes Graff, MD, MPH  Maternal Child Health Fellow   [facility] [facility] [place], [facility] Medicine

## 2018-05-03 NOTE — Progress Notes (Signed)
Attending    Shantinique is doing well but not feeling much. Pit was held at 6 mu/min due to census but now is able to increase.    She is still declining the COOK    Afeb VSS  Fh cat 1  Toco irreg  VE deferred    A: IOL for poor interval growth, small AC, smoker @ 38+ weeks    Plan: continue pitocin  Titrate to effect   Continue to consider AROM but if can get into a regular pattern then AROM  Epidural when desired  Anticipate SVD  Bartholome Bill, MD

## 2018-05-03 NOTE — Anesthesia Procedure Notes (Deleted)
---------------------------------------------------------------------------------------------------------------------------------------    NEURAXIAL BLOCK PLACEMENT  Labor Epidural    Date of Procedure: 05/03/2018 8:02 PM    Patient Location:  L&D    Reason for Block: labor epidural    CONSENT AND TIMEOUT     Consent:  Obtained per policy    Timeout: patient identified (name/DOB) , operative procedure/site/side verified by patient or family , operative procedure/side/site verified against surgical consent and operative procedure/side/site verified against surgical schedule  METHOD:    Patient Position: sitting    Monitoring: blood pressure and continuous pulse oximetry    Sedation Used: no            For medications used, please see MAR    Level of Sedation: none      Prep: povidone-iodine      Successful Approach: midline    Successful Location: L2-3    Technique: LOR air    Attempts (Skin Punctures):  2    Attempted Approach:  midline    Attempted Location: L2-3     Lot Number: [id number]; Expiration Date: 2019-06-26  NEEDLE AND CATHETER:  Needle:    Needle Type: Tuohy    Needle Gauge:  18 G    Needle Length:  9 cm    Needle Insertion Depth:  6  Catheter:     Catheter Size: 20 gauge    Catheter Type: closed tip (multi-orifice)  Tunneled: No      Catheter in Space:  6    Catheter at Skin: 12  TESTING AND VALIDATION:    Catheter Aspirate: negative      Test Dose Response: negative  OBSERVATIONS:    Block Completion:  Successfully completed  Wet Tap: No      Paresthesia: none  Neuraxial Blood: blood not aspirated      Patient Reaction to Block: tolerated procedure well, pain relief, vitals remained stable and fetal stability  STAFF     Performed by: attending anesthesiologist personally performed    Attending Attestation: I personally performed the procedure     Attending: Stann Mainland,  MD  ----------------------------------------------------------------------------------------------------------------------------------------

## 2018-05-03 NOTE — Discharge Instructions (Addendum)
[facility]    [address]  [place] Location:  [phone]  [place], [place] [phone]             [place] Location:   [phone]                                                      POSTPARTUM  INSTRUCTIONS  General Activity:  On the day of discharge, go directly home and rest.  Minimize visitors the first few weeks, so you can rest.  Increase activity such as walking and stair climbing gradually.  No strenuous activity or heavy lifting should be done for several weeks.  Do not drive for a week after your delivery.  It is easy to have an accident when you are tired. Travel is fine after 2 weeks, but if on a long trip get out and walk every 2 hours to maintain circulation.  Often your feet and ankles will become swollen during the week after delivery.  If you had IV fluids, you may swell for several weeks.      Employment:  You may return to work 6 weeks after delivery.  This is standard [place].    Stitches and or Hemorrhoids:  Keep the perineal are clean with baths, showers or Sitz baths.  Americaine or similar ointment is available over the counter and can be applied as needed to stitches.  Preparation H, Anusol and Tucks are also fine for hemorrhoids.    Breast Care:  If you are bottle feeding, a snug bra will be helpful in preventing engorgement.  If engorgement does occur, apply ice packs to breasts and medicate with Ibuprofen.  This must be done continuously for several days.  Medication is no longer used to "dry up" your breast milk.      Breast feeding takes patience and perseverance.  Remember, this is a new learning experience for you and your baby.  For the first few weeks, your baby may nurse every 2-3 hours.  Good nutrition, fluids and rest are important for your own well being, so try to rest as much as possible in between feedings.  If the baby doesn't empty your breasts and they become engorged, you may express the rest of the milk.   Nipple tenderness or soreness, peaks at 5-7 days of nursing.  To correct this, check the areola (pigmented area around breasts), not just the nipple.  Letting a little breast milk dry o the exposed nipple and applying ice packs to nipples, helps healing.  PurLan and Soft Shields also help.  Clogged ducts (feel like stringy clumps) can be relieved with massage and a warm shower or wash cloth on the breast prior to nursing.  Breast infections can occur whether you are nursing or not.  Symptoms include fever, chills, painful area on your breast.  Usually antibiotics are needed, so call us during the day with your pharmacy number.  If you need to use medications other than what we list at the end of these instructions, check with your pediatrician's office.      Abdominal Cramps:  "After birth pains" can be severe, especially with nursing and after the birth of the second or more child.  They rarely last more than 4 days.  See Pain Medications list   if you need something for your cramps.      Vitamins and Iron:  Continue for 1 month if not nursing.  Continue the vitamins throughout nursing.  Continue iron for 1 month.    Bathing:  Showers and baths are fine.  No douching.     Constipation:  Stool softeners such as Colace, Metamucil, Senokot or generic are fine to use.  Use milk of magnesia , as necessary.  No prescription is needed for any of these.      Menstruation:  Bleeding is expected for several weeks and up to 12 weeks is not abnormal.  It may be red, blackish with clots.  It changes to pink and then yellow-gray.  Frequently, the discharge becomes red again.  If the bleeding gets heavy again, you need to rest more.  The return of a real menstrual period varies.  For non-nursing mothers, it is usually between 4-10 weeks.  For nursing mothers, it can take over a year.  Several months of periods are usually required before cycles become regular.  Bleeding may increase after you go home, due to increased activities.       Sexual Intercourse:  It takes varying times for episiotomies, hemorrhoids and swelling to go away so that you are comfortable. Two weeks is usually the minimum wait.   Nursing causes vaginal dryness and lubrication is often required.  Remember, the possibility of becoming pregnant exists, even while nursing.  You ovulate 2 weeks before the return of the first period, so you may become pregnant before your first period.      Contraception:  Progesterone-only pills, Depo Provera, the IUD (Skyla, Kyleena, Mirena, Paragard), the contraceptive implant (Nexplanon), and barrier methods of contraception may all be safely used postpartum and with breastfeeding. We recommend abstinence until you are seen for your postpartum visit. Please plan to discuss contraception at that time.    Postpartum Examination:  Call our office to schedule your appointment.  Usually it is best to see the doctor who delivered your baby so that questions or issues regarding your delivery can be answered.  This visit will be scheduled 6 weeks after you delivered.  After that, you may wish to return to your usual care provider.      Call if the following occur:      1. Fever of 101 degrees or severe chills.   2. Excessively heavy bleeding or "flooding".   3. Extreme urinary frequency and burning with urination.   4. Swelling or tenderness in one area of the breast.   5. Marked depression or anxiety.    Check your temperature before calling, if possible.  You may feel hot but you cannot determine "fever" without a thermometer.  For non emergencies, messages may be left on our tape after hours or on weekends and we will return your call the next business day.  For acute emergencies, call [phone].  Please have your pharmacy number ready when you call.  Our office hours are Monday through Friday, 8:30 a.m. - 4:30 p.m.    Write your pharmacy number here so it is easily available:___________________    Pain Medication:  Ibuprofen 600 mg every 4-6  hours or Acetaminophen are safe to take while nursing.   In closing,   You will experience many physical and emotional changes during this time.  Rest when you can and be good to yourself.  Let us know if we can help in any way.            The doctors, nurses and staff of [facility]

## 2018-05-04 LAB — HEMATOCRIT: Hematocrit: 30 % — ABNORMAL LOW (ref 34–45)

## 2018-05-04 LAB — MCHC: MCHC: 33 g/dL (ref 32–36)

## 2018-05-04 NOTE — Progress Notes (Signed)
Social Work  Contacts: Amy Osborn and Amy Osborn FOB  Summary: Amy Osborn is a 33 year old woman who delivered her second baby.  She is prepared at home for baby with equipment and supplies.  Amy Osborn struggled with depression and is in therapy.  I did review post partum depression/anxiety signs and symptoms both verbally and in writing and suggested Amy Osborn call provider with any issues.  + bonding observed with baby.  Plan:  No further social work needs currently indicated.  Glenna Fellows, LCSWR

## 2018-05-04 NOTE — Plan of Care (Signed)
See care plan. Amy Osborn M Amy Mclees, RN

## 2018-05-04 NOTE — Plan of Care (Signed)
Per care plan 

## 2018-05-04 NOTE — Progress Notes (Signed)
OBSTETRICS VAGINAL DELIVERY PROGRESS NOTE   Postpartum Day: 1    Subjective     Amy Osborn is doing well postpartum.  Pain is well-controlled with current regimen.  Tolerating regular diet without nausea/vomiting.  She has ambulated.  Denies chest pain, SOB, or lightheadedness.  Appropriate vaginal bleeding.  Baby is well and breastfeeding.      Objective     Vitals:    05/03/18 2236 05/04/18 0435 05/04/18 0810 05/04/18 1100   BP: 103/54 107/50 106/58 102/61   Pulse: 68 70 64 75   Resp: 16 16 16 16    Temp: 36.5 C (97.7 F) 36.3 C (97.3 F) 36.2 C (97.2 F) 36 C (96.8 F)   TempSrc: Temporal Temporal Temporal Temporal   SpO2: 95% 96% 98% 97%   Weight:       Height:           Mental Status: Alert and oriented x 3  Cardiovascular: Regular rate and rhythm with no murmurs  Respiratory: Clear to auscultate  Abdomen: Soft, appropriately tender, nondistended, +BS  Fundus: Fundus firm at umbilicus -1  Neurological: Not assessed  Extremities/Skin: No edema noted      Labs     Recent Labs   Lab 05/04/18  0442 05/02/18  1912   Hematocrit 30* 35       ABO RH Blood Type (no units)   Date Value   05/02/2018 O RH POS                    Rubella IgG AB (no units)   Date Value   10/09/2017 POSITIVE         Assessment & Plan     Amy Osborn is a 33 y.o. G2P2002 on PPD# 1 s/p Vaginal, Spontaneous  at [redacted]w[redacted]d complicated by IUGR.  Doing well.    Routine postpartum care:  - Rh status as above. Rhogam is not indicated.    - Infant: Female infant.  - Feeding: breast  - PPBC: defer to postpartum   - PPHct as above. Ferrous sulfate is not indicated on discharge.  - Immunizations: UTD.       Disposition: Plan for D/C home PPD # 2.    Junius Creamer, MD  Maternal Child Health Fellow  [place] [facility] , [facility]  [facility] Family Medicine

## 2018-05-04 NOTE — Lactation Note (Signed)
This note was copied from a baby's chart.   Lactation Consultant Initial Visit   Patient: Amy Osborn MRN: [id number]  AGE: 33 years    Maternal Information    Mothers Name: Marthenia RollingWalls,Rochel   Age: 33 years   OB History   Gravida Para Term Preterm AB Living   2 2 2  0 0 2   SAB TAB Ectopic Multiple Live Births   0 0 0 0 2      Delivery Date/Time: 05/03/2018 8:20 PM Delivery Type: Vaginal, Spontaneous   Support Person: yes  fob  Breastfeeding History: wanted to Bf first but told you wont get milk with your breasts per mom, discussed monitor baby I/O, wgt, supple, BF and add supplement if needed    Maternal Assessment    Breastfeeding:  assisted  Positioning: Chest to Chest, Body Alignment, Football, Pillow and Breast Support , STS  Nipple Assessment:    Size: Medium    Condition: -Normal               Nipple Shield:  no,   Breast Assessment: Size: Small/Med and Asymmetrical   Condition: -Soft, can palpate some glandular tissue, express few gtts, per mom minimal changes with pregnancy  Comfortable with latch: yes  BF Confidence: Low   Currently Pumping: No  Comments: teaching and support, breast feeding and supplement if indicated   Log guides and monitor baby and supply    Newborn Assessment    Newborn Name: Amy Manganello Sex: female GA: 2038 3/7   Pediatrician: Peter GarterBlackburn, Robert, MD  Infant Weight: Birth Weight: 2919 g (6 lb 7 oz)   Today's Wt: 2919 g (6 lb 7 oz)(Filed from Delivery Summary)   Percent Weight Loss: 0 %  F/V/S 24 hours:   Date 05/03/18 1500 - 05/04/18 0659 05/04/18 0700 - 05/05/18 0659   Shift 1500-2259 2300-0659 24 Hour Total 0700-1459 1500-2259 2300-0659 24 Hour Total   INTAKE   Breast Feeding            BF Occurrence 1 x 3 x 4 x 2 x   2 x     BF Attempts 1 x  1 x       Shift Total(mL/kg)          OUTPUT   Urine(mL/kg/hr)  0(0) 0(0) 1(0)   1     Urine  0 0 1   1     Urine Occurrence 2 x 1 x 3 x       Emesis/NG output  0 0         Emesis  0 0         Emesis Occurrence  1 x 1 x       Stool            Stool Occurrence 0 x  2 x 2 x       Shift Total(mL/kg)  0(0) 0(0) 1(0.3)   1(0.3)   NET  0 0 -1   -1   Weight (kg) 2.9 2.9 2.9 2.9 2.9 2.9 2.9      Baby's Feeding:   Is baby waking: yes  Tongue: Effective Latch   Attachment: Good root, Lips Flanged, Jaw glide, Tongue down, Swallow, Nutritive suck and Non-nutritive suck   Characteristics: Awake Root Intermittent Suckling  Is Pt.Curr Supplementing: no    Teaching    Breast/Nipple care, Positioning, Areolar expression, Correct attachment, Frequency/length of feeds, Baby feeding cues, Signs of effective suckling/letdown, Waking Techniques, Signs of adequate infant  intake (# of wet/stool diaper per 24 hrs), Breastfeeding Log Given, Introduction of bottles/pacifiers, Cluster Feeding and Insurance Information Regarding Pumps, monitor supply and baby I/O and wgt, Newborn behaviors, STS, our support    Plan for next 24 hours   Follow up LC: Tomorrow and today as paged.    Selmer Dominion, RN, [facility]  Lactation Consultant

## 2018-05-04 NOTE — Progress Notes (Signed)
OBSTETRICS VAGINAL DELIVERY PROGRESS NOTE   Postpartum Day: 1    Subjective     Amy Osborn is doing well this morning. Slept well.  Pain is well-controlled with current regimen.  Tolerating regular diet without nausea/vomiting.  She has ambulated.  Denies chest pain, SOB, or lightheadedness.  Appropriate vaginal bleeding.  Baby is well and has strong latch.      Objective     Vitals:    05/03/18 2115 05/03/18 2130 05/03/18 2236 05/04/18 0435   BP: 101/51 112/61 103/54 107/50   Pulse: 70 81 68 70   Resp:   16 16   Temp: 36.9 C (98.4 F)  36.5 C (97.7 F) 36.3 C (97.3 F)   TempSrc: Axillary  Temporal Temporal   SpO2:   95% 96%   Weight:       Height:           Mental Status: Alert and oriented x 3  Cardiovascular: Regular rate and rhythm with no murmurs  Respiratory: Clear to auscultate  Abdomen: Soft, appropriately tender, nondistended, +BS  Fundus: Fundus firm at umbilicus -1  Neurological: Not assessed  Extremities/Skin: No edema noted      Labs     Recent Labs   Lab 05/04/18  0442 05/02/18  1912   Hematocrit 30* 35       ABO RH Blood Type (no units)   Date Value   05/02/2018 O RH POS                    Rubella IgG AB (no units)   Date Value   10/09/2017 POSITIVE         Assessment & Plan     [name] is a 33 y.o. G2P2002 on PPD# 1 s/p Vaginal, Spontaneous  at [redacted]w[redacted]d complicated by IUGR.  Doing well.    Routine postpartum care:  - Rh status as above. Rhogam is not indicated.    - Infant: Female infant.  - Feeding: breast  - PPBC: Did not ask  - PPHct as above. Ferrous sulfate is not indicated on discharge.  - Immunizations: UTD.       Disposition: Plan for D/C home PPD # 2.    Dennis Hegeman Jamse Belfast, DO  FAMILY MEDICINE PGY-1  05/04/18 7:28 AM

## 2018-05-05 ENCOUNTER — Ambulatory Visit: Payer: Self-pay

## 2018-05-05 NOTE — Progress Notes (Signed)
Patient education is adequate for discharge. Instructions given, reviewed and signed by patient. Questions answered. No concerns at this time. Newborn security tag reviewed. Discharged home in stable condition.

## 2018-05-05 NOTE — Anesthesia Postprocedure Evaluation (Signed)
Anesthesia Post-Op Note    Patient: Amy Osborn    Procedure(s) Performed:  Procedure Summary  Date:  05/03/2018 Anesthesia Start: 05/03/2018  6:15 PM Anesthesia Stop: 05/03/2018  8:20 PM Room / Location:  * No operating room entered * / HH ANESTHESIA AD HOC   * No procedures listed * Diagnosis:  * No pre-op diagnosis entered * * No surgeons listed * Attending Anesthesiologist:  Stann Mainland, MD         Recovery Vitals  BP: 120/57 (05/05/2018  8:00 AM)  Heart Rate: 75 (05/05/2018  8:00 AM)  Resp: 20 (05/05/2018  8:00 AM)  Temp: 36.6 C (97.9 F) (05/05/2018  8:00 AM)  SpO2: 98 % (05/05/2018  8:00 AM)   0-10 Scale: 1 (05/05/2018  8:00 AM)    Anesthesia type:  No value filed.  Complications Noted During Procedure or in PACU:  None   Comment:    Patient Location:  Labor and Delivery  Level of Consciousness:    Recovered to baseline  Patient Participation:     Able to participate  Temperature Status:    Normothermic  Oxygen Saturation:    Within patient's normal range  Cardiac Status:   Within patient's normal range  Fluid Status:    Stable  Airway Patency:     Yes  Pulmonary Status:    Baseline  Neuraxial Block Evaluation:    No residual motor or sensory symptoms  Pain Management:    Adequate analgesia  Nausea and Vomiting:  None    Post Op Assessment:    Tolerated procedure well"   Attending Attestation:  All indicated post anesthesia care provided       -

## 2018-05-05 NOTE — Plan of Care (Signed)
Problem: Uteroplacental perfusion  Goal: Reduce the risk of infection  Outcome: Maintaining     Problem: Vaginal Delivery - Recovery and Post Partum  Goal: Vital signs are medically acceptable  Outcome: Maintaining  Goal: Respiratory - Lungs clear to auscultation  Outcome: Maintaining  Goal: Cardiovascular - Homan's sign negative  Outcome: Maintaining  Goal: Patient will remain free of falls  Outcome: Maintaining  Goal: Fundus firm at midline  Outcome: Maintaining  Goal: Moderate rubra freeflow, no purulent discharge, no foul smelling lochia  Outcome: Maintaining  Goal: Empties bladder  Outcome: Maintaining  Goal: Verbalizes understanding of normal bowel function resumption  Outcome: Maintaining  Goal: Edema will be absent or minimal  Outcome: Maintaining  Goal: Breasts are soft with nipple integrity intact  Outcome: Maintaining  Goal: Demonstrates appropriate breast feeding techniques  Outcome: Maintaining  Goal: Appropriate behavior observed  Outcome: Maintaining  Goal: Positive Mother-Baby interactions are observed  Outcome: Maintaining  Goal: Perineum intact without discharge or hematoma  Outcome: Maintaining  Goal: Ambulates independently  Outcome: Maintaining     Problem: Pain  Goal: Patient's pain/discomfort is manageable  Outcome: Maintaining  Goal: Report decrease in pain level  Description  Alleviation of pain or a reduction in pain to a level of comfort that is acceptable to the patient.  Outcome: Maintaining     Problem: Knowledge Deficit  Goal: Patient/S.O. demonstrates understanding of disease process, treatment plan, medications, and discharge instructions.  Outcome: Maintaining     Problem: Safety - OB  Goal: Follow unit's safety guidelines  Outcome: Maintaining

## 2018-05-05 NOTE — Lactation Note (Signed)
This note was copied from a baby's chart.  [facility] Lactation    Lactation Consultant Discharge Visit     Maternal Assessment    Breastfeeding: Well and Independently - per mother  Positioning: DNO. Newborn sleeping during LC visit  Attachment: Good root, Lips Flanged, Jaw glide, Tongue down, Swallow and Nutritive suck - per mother  Comfortable with latch: yes    Nipple Assessment:        Condition: R-Normal and L-Normal        Nipple Shield: No     Breast Assessment:         Condition: R-Soft and L-Soft and Widespaced       Currently Pumping: No, however a hand pump given and encouraged mother to begin pump after feedings (Double pump 15 minutes 8 times in 24 hours) r/t 6.7 % weight loss at 24 hours. Mother stated her pump should arrive at her home today    Newborn Assessment    Newborn Name: Girl Neidhart Sex: female GA: 13 3/7   Pediatrician: Peter Garter, MD  Ped Phone: [phone]  Infant Weight: Birth Weight: 2919 g (6 lb 7 oz)   Today's Wt: 2723 g (6 lb 0.1 oz)(24 hours)   Percent Weight Loss: -6.71 %  F/V/S 24 hours:   Date 05/04/18 0700 - 05/05/18 0659 05/05/18 0700 - 05/06/18 0659(Discharged)   Shift 1638-4665 1500-2259 2300-0659 24 Hour Total 0700-1459 1500-2259 2300-0659 24 Hour Total   INTAKE   Breast Feeding             BF Occurrence 3 x 2 x 5 x 10 x 1 x   1 x   Shift Total(mL/kg)           OUTPUT   Urine(mL/kg/hr) 1(0)   1(0)         Urine 1   1         Urine Occurrence  2 x 1 x 3 x 0 x   0 x   Stool             Stool Occurrence  1 x  1 x 0 x   0 x   Shift Total(mL/kg) 1(0.3)   1(0.4)       NET -1   -1       Weight (kg) 2.9 2.7 2.7 2.7 2.7 2.7 2.7 2.7      Any formula supplementation during hospital stay: No    Comments:  Baby's feeding, voids and stools are all within normal limits.  Reviewed goals with mother.      Information handouts given during hospital stay:  Breastfeeding Guide and Feeding Log  Engorgement  Paced Bottle Feeding  How to Know Newborn is Getting Enough/Milk  Storage    Teaching    Positioning, Areolar expression, Correct attachment, Frequency/length of feeds, Signs of adequate infant intake (# of wet/stool diaper per 24 hrs), Engorgment Management, Resources for HELP, Pumping/Storage, Introduction of bottles/pacifiers and Cluster Feeding    Plan   Follow up: phone call by Lactation office and mom can call PRN.  Follow up Peds: 1-3 days     Chester Holstein, RN, [facility]  Lactation Consultant

## 2019-02-25 ENCOUNTER — Other Ambulatory Visit
Admission: RE | Admit: 2019-02-25 | Discharge: 2019-02-25 | Disposition: A | Discharge status: Home / Self Care | Payer: Medicaid Other | Source: Ambulatory Visit

## 2019-02-25 ENCOUNTER — Other Ambulatory Visit
Admission: RE | Admit: 2019-02-25 | Discharge: 2019-02-25 | Disposition: A | Discharge status: Home / Self Care | Payer: Medicaid Other | Source: Ambulatory Visit | Attending: Obstetrics and Gynecology | Admitting: Obstetrics and Gynecology

## 2019-02-25 ENCOUNTER — Other Ambulatory Visit: Payer: Self-pay | Admitting: Gastroenterology

## 2019-02-25 DIAGNOSIS — Z113 Encounter for screening for infections with a predominantly sexual mode of transmission: Secondary | ICD-10-CM | POA: Insufficient documentation

## 2019-02-25 DIAGNOSIS — Z124 Encounter for screening for malignant neoplasm of cervix: Secondary | ICD-10-CM | POA: Insufficient documentation

## 2019-02-25 LAB — SYPHILIS SCREEN
Syphilis Screen: NEGATIVE
Syphilis Status: NONREACTIVE

## 2019-02-25 LAB — HEPATITIS C ANTIBODY: Hep C Ab: NEGATIVE

## 2019-02-25 LAB — HIV 1&2 ANTIGEN/ANTIBODY: HIV 1&2 ANTIGEN/ANTIBODY: NONREACTIVE

## 2019-02-25 LAB — HEPATITIS B SURFACE ANTIGEN: HBV S Ag: NEGATIVE

## 2019-02-27 LAB — TRICHOMONAS DNA AMPLIFICATION: Trichomonas DNA amplification: 0

## 2019-02-27 LAB — CHLAMYDIA PLASMID DNA AMPLIFICATION: Chlamydia Plasmid DNA Amplification: 0

## 2019-02-27 LAB — N. GONORRHOEAE DNA AMPLIFICATION: N. gonorrhoeae DNA Amplification: 0

## 2019-03-04 LAB — GYN CYTOLOGY

## 2020-05-15 LAB — UNMAPPED LAB RESULTS
Basophil # (HT): 0.1 10 3/uL (ref 0.0–0.2)
Basophil % (HT): 1 % (ref 0–2)
Eosinophil # (HT): 0.3 10 3/uL (ref 0.0–0.5)
Eosinophil % (HT): 3 % (ref 0–7)
Hematocrit (HT): 45 % (ref 34–47)
Hemoglobin (HGB) (HT): 15 g/dL (ref 11.5–16.0)
Lymphocyte # (HT): 2.4 10 3/uL (ref 0.9–3.8)
Lymphocyte % (HT): 19 % (ref 17–44)
MCHC (HT): 33.3 g/dL (ref 32.0–36.0)
MCV (HT): 83.8 fL (ref 81.0–99.0)
Mean Corpuscular Hemoglobin (MCH) (HT): 27.9 pg (ref 26.0–34.0)
Monocyte # (HT): 0.8 10 3/uL (ref 0.2–1.0)
Monocyte % (HT): 7 % (ref 4–12)
Neutrophil # (HT): 8.7 10 3/uL — ABNORMAL HIGH (ref 1.5–7.7)
Platelets (HT): 202 10 3/uL (ref 140–400)
RBC (HT): 5.38 10 6/uL — ABNORMAL HIGH (ref 3.80–5.20)
RDW (HT): 12.8 % (ref 11.5–15.0)
Seg Neut % (HT): 71 % (ref 40–75)
WBC (HT): 12.3 10 3/uL — ABNORMAL HIGH (ref 4.0–10.8)

## 2020-05-18 LAB — UNMAPPED LAB RESULTS: VZV IgG (HT): 1.9 AI

## 2020-06-24 ENCOUNTER — Emergency Department
Admission: EM | Admit: 2020-06-24 | Discharge: 2020-06-24 | Discharge status: Against Medical Advice | Payer: Medicaid Other | Source: Ambulatory Visit

## 2021-06-02 ENCOUNTER — Emergency Department
Admission: EM | Admit: 2021-06-02 | Discharge: 2021-06-02 | Disposition: A | Discharge status: Home / Self Care | Payer: Medicaid Other | Source: Ambulatory Visit | Attending: Emergency Medicine | Admitting: Emergency Medicine

## 2021-06-02 ENCOUNTER — Encounter: Payer: Self-pay | Admitting: Emergency Medicine

## 2021-06-02 ENCOUNTER — Emergency Department: Payer: Medicaid Other | Admitting: Radiology

## 2021-06-02 ENCOUNTER — Other Ambulatory Visit: Payer: Self-pay

## 2021-06-02 DIAGNOSIS — R079 Chest pain, unspecified: Secondary | ICD-10-CM | POA: Insufficient documentation

## 2021-06-02 DIAGNOSIS — Z789 Other specified health status: Secondary | ICD-10-CM

## 2021-06-02 DIAGNOSIS — I517 Cardiomegaly: Secondary | ICD-10-CM

## 2021-06-02 DIAGNOSIS — F17210 Nicotine dependence, cigarettes, uncomplicated: Secondary | ICD-10-CM | POA: Insufficient documentation

## 2021-06-02 DIAGNOSIS — R0789 Other chest pain: Secondary | ICD-10-CM

## 2021-06-02 LAB — CBC AND DIFFERENTIAL
Baso # K/uL: 0 10*3/uL (ref 0.0–0.1)
Basophil %: 0.3 %
Eos # K/uL: 0.2 10*3/uL (ref 0.0–0.4)
Eosinophil %: 1.7 %
Hematocrit: 44 % (ref 34–45)
Hemoglobin: 14.9 g/dL (ref 11.2–15.7)
Lymph # K/uL: 1.7 10*3/uL (ref 1.2–3.7)
Lymphocyte %: 18.9 %
MCH: 27 pg (ref 26–32)
MCHC: 34 g/dL (ref 32–36)
MCV: 81 fL (ref 79–95)
Mono # K/uL: 0.5 10*3/uL (ref 0.2–0.9)
Monocyte %: 6 %
Neut # K/uL: 6.4 10*3/uL — ABNORMAL HIGH (ref 1.6–6.1)
Platelets: 199 10*3/uL (ref 160–370)
RBC: 5.5 MIL/uL — ABNORMAL HIGH (ref 3.9–5.2)
RDW: 14.9 % — ABNORMAL HIGH (ref 11.7–14.4)
Seg Neut %: 73.1 %
WBC: 8.8 10*3/uL (ref 4.0–10.0)

## 2021-06-02 LAB — BASIC METABOLIC PANEL
Anion Gap: 10 (ref 7–16)
CO2: 23 mmol/L (ref 20–28)
Calcium: 9 mg/dL (ref 8.8–10.2)
Chloride: 108 mmol/L (ref 96–108)
Creatinine: 0.65 mg/dL (ref 0.51–0.95)
Glucose: 98 mg/dL (ref 60–99)
Lab: 9 mg/dL (ref 6–20)
Potassium: 4.4 mmol/L (ref 3.3–5.1)
Sodium: 141 mmol/L (ref 133–145)
eGFR BY CREAT: 118 *

## 2021-06-02 LAB — EKG 12-LEAD
P: 77 deg
PR: 120 ms
QRS: 75 deg
QRSD: 101 ms
QT: 413 ms
QTc: 445 ms
Rate: 70 {beats}/min
T: 71 deg

## 2021-06-02 LAB — TROPONIN T 0 HR HIGH SENSITIVITY (IP/ED ONLY): TROP T 0 HR High Sensitivity: <6 ng/L (ref 0–11)

## 2021-06-02 LAB — POCT URINE PREGNANCY
Lot #: 196348
Preg Test,UR POC: NEGATIVE

## 2021-06-02 MED ORDER — ASPIRIN 81 MG PO CHEW *I*
324.0000 mg | CHEWABLE_TABLET | Freq: Once | ORAL | Status: AC
Start: 2021-06-02 — End: 2021-06-02
  Administered 2021-06-02: 324 mg via ORAL
  Filled 2021-06-02: qty 4

## 2021-06-02 MED ORDER — KETOROLAC TROMETHAMINE 30 MG/ML IJ SOLN *I*
15.0000 mg | Freq: Once | INTRAMUSCULAR | Status: AC
Start: 2021-06-02 — End: 2021-06-02
  Administered 2021-06-02: 15 mg via INTRAVENOUS
  Filled 2021-06-02: qty 1

## 2021-06-02 NOTE — ED Provider Notes (Signed)
History     Chief Complaint   Patient presents with    Chest Pain     36 year old female with a history of anxiety, kidney stones, presents emergency department with left-sided chest pain radiating around the side of her chest to her back, since yesterday at about 8 PM.  The pain is sharp, relieved by lying flat, worsened by sitting up.  She denies cough, shortness of breath, fever, abdominal pain, nausea, vomiting, diaphoresis, leg pain, leg swelling.  The pain began spontaneously.  She has no history of heart disease, blood clots, recent travel, recent surgery.            Medical/Surgical/Family History     Past Medical History:   Diagnosis Date    Anxiety     Complication of anesthesia     n/v after anesthetics per pt    Kidney stones     Kidney stones     Mood disorder     Nephrolithiasis         Patient Active Problem List   Diagnosis Code    Nicotine dependence F17.200    Umbilical discharge R19.8    Pink eye H10.029    Conjunctivitis H10.9    SVD (spontaneous vaginal delivery) O80            Past Surgical History:   Procedure Laterality Date    LEEP      LITHOTRIPSY       Family History   Problem Relation Age of Onset    Anxiety disorder Mother     No Known Problems Father           Social History     Tobacco Use    Smoking status: Every Day     Packs/day: 0.50     Years: 10.00     Pack years: 5.00     Types: Cigarettes    Smokeless tobacco: Never   Substance Use Topics    Alcohol use: Yes     Alcohol/week: 0.0 standard drinks of alcohol     Comment: occas 1x/ wk    Drug use: No     Living Situation     Questions Responses    Patient lives with Family    Homeless No    Caregiver for other family member No    External Services None    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  First Pain Reported  0-10 Scale: 7, Pain Location/Orientation: Chest, (06/02/21 1004)       Physical Exam  Vitals and nursing note reviewed.   Constitutional:       General: She is in acute distress.      Appearance: Normal appearance. She is not ill-appearing, toxic-appearing or diaphoretic.   HENT:      Head: Normocephalic.      Nose: Nose normal.      Mouth/Throat:      Mouth: Mucous membranes are moist.   Eyes:      Extraocular Movements: Extraocular movements intact.  Conjunctiva/sclera: Conjunctivae normal.      Pupils: Pupils are equal, round, and reactive to light.   Cardiovascular:      Rate and Rhythm: Normal rate and regular rhythm.      Heart sounds: Normal heart sounds.   Pulmonary:      Effort: Pulmonary effort is normal.      Breath sounds: Normal breath sounds.   Chest:      Chest wall: Tenderness present.   Abdominal:      Palpations: Abdomen is soft.      Tenderness: There is no abdominal tenderness.   Musculoskeletal:         General: No tenderness.      Right lower leg: No edema.      Left lower leg: No edema.   Neurological:      Mental Status: She is alert.         Medical Decision Making   Patient seen by me on:  06/02/2021    Assessment:  36 year old female presents the emergency department with left-sided chest pain since yesterday evening.  Mild chest wall tenderness.  Vital signs normal.  Patient is PERC negative, low risk factors for heart disease other than smoking.    Differential diagnosis:  Pneumonia, pneumothorax, musculoskeletal chest pain, pericarditis, myocarditis, doubt acute coronary syndrome    Plan:  CBC, chemistry, troponins, EKG, chest x-ray, aspirin, Toradol, reassess.    EKG Interpretation: tracing reviewed by myself, normal sinus rhythm, no ischemic changes    ED Course and Disposition:    EKG with no acute changes.  CBC, Chem normal.  0 hour troponin < 6.  CXR  normal.  Symptoms improved after aspirin and toradol.  Discharged home.              Jarrett Ables, MD             Jarrett Ables, MD  06/02/21 [phone]

## 2021-06-02 NOTE — ED Notes (Signed)
Plan of Care: Will assess and monitor VS and pain q 2-4hrs. Perform frequent rounding. Provide education to patient/caregiver(s) about status and treatment of pt. Provide support to patient/caregiver(s) as needed. Pt oriented to room and use of call bell.

## 2021-06-02 NOTE — Discharge Instructions (Signed)
Take ibuprofen 600 mg every 8 hours as needed for pain.  Take with food or on a full stomach to avoid stomach upset.

## 2021-06-02 NOTE — ED Triage Notes (Signed)
Mid to left chest pain radiating around to left upper back. Pain started yesterday at 8pm, then she went to sleep, pain woke her up at 5:30am and has been constant. Denies dizziness/shortness of breath. Pt is tearful, seems anxious. She reports starting on Wellbutrin last week for the first time, takes no other medications. EKG completed.               Prehospital medications given: No

## 2022-05-30 LAB — UNMAPPED LAB RESULTS
Basophil # (HT): 0 10 3/uL (ref 0.0–0.2)
Basophil % (HT): 0 % (ref 0–2)
Eosinophil # (HT): 0.1 10 3/uL (ref 0.0–0.5)
Eosinophil % (HT): 1 % (ref 0–7)
Hematocrit (HT): 49 % — ABNORMAL HIGH (ref 34–47)
Hemoglobin (HGB) (HT): 16.1 g/dL — ABNORMAL HIGH (ref 11.5–16.0)
Lymphocyte # (HT): 1.9 10 3/uL (ref 0.9–3.8)
Lymphocyte % (HT): 20 % (ref 17–44)
MCHC (HT): 33.1 g/dL (ref 32.0–36.0)
MCV (HT): 84.2 fL (ref 81.0–99.0)
Mean Corpuscular Hemoglobin (MCH) (HT): 27.9 pg (ref 26.0–34.0)
Monocyte # (HT): 0.7 10 3/uL (ref 0.2–1.0)
Monocyte % (HT): 7 % (ref 4–12)
Neutrophil # (HT): 6.8 10 3/uL (ref 1.5–7.7)
Platelets (HT): 213 10 3/uL (ref 150–450)
RBC (HT): 5.77 10 6/uL — ABNORMAL HIGH (ref 3.80–5.20)
RDW (HT): 12.8 % (ref 11.5–15.0)
Seg Neut % (HT): 71 % (ref 40–75)
WBC (HT): 9.5 10 3/uL (ref 4.0–10.8)

## 2022-08-08 ENCOUNTER — Other Ambulatory Visit: Payer: Self-pay

## 2022-08-08 ENCOUNTER — Emergency Department
Admission: EM | Admit: 2022-08-08 | Discharge: 2022-08-08 | Disposition: A | Discharge status: Home / Self Care | Payer: No Typology Code available for payment source | Source: Ambulatory Visit | Attending: Emergency Medicine | Admitting: Emergency Medicine

## 2022-08-08 DIAGNOSIS — H9202 Otalgia, left ear: Secondary | ICD-10-CM

## 2022-08-08 DIAGNOSIS — J069 Acute upper respiratory infection, unspecified: Secondary | ICD-10-CM

## 2022-08-08 DIAGNOSIS — F17210 Nicotine dependence, cigarettes, uncomplicated: Secondary | ICD-10-CM | POA: Insufficient documentation

## 2022-08-08 NOTE — ED Triage Notes (Signed)
Left otalgia x 1 week intermittent. Eating and drinking well. No meds.       Prehospital medications given: No

## 2022-08-08 NOTE — ED Notes (Signed)
Assumed pt care. Agree with triage note.    Nursing Care Plan:  Will monitor and assess VS and pain scores every 2-4 hours and prn.  Perform frequent rounding prn.  Provide updates to patient and/or cargiver frequently.  Provide support to patient/caregiver as needed.  Teach patient and/or caregivers about patients needs/status working towards discharge.  Patient oriented to room and given call bell.

## 2022-08-10 NOTE — ED Provider Notes (Signed)
History     Chief Complaint   Patient presents with    Otalgia     Pt is a 37 yr old female pmh anxiety nephrolithiasis presents for left otalgia x one week intermittent. Mild intermittent pressure no modifying factors. No meds pta. No fever,chills.           Medical/Surgical/Family History     Past Medical History:   Diagnosis Date    Anxiety     Complication of anesthesia     n/v after anesthetics per pt    Kidney stones     Kidney stones     Mood disorder     Nephrolithiasis         Patient Active Problem List   Diagnosis Code    Nicotine dependence F17.200    Umbilical discharge R19.8    Pink eye H10.029    Conjunctivitis H10.9    SVD (spontaneous vaginal delivery) O80            Past Surgical History:   Procedure Laterality Date    LEEP      LITHOTRIPSY            Social History     Tobacco Use    Smoking status: Every Day     Packs/day: 0.50     Years: 10.00     Additional pack years: 0.00     Total pack years: 5.00     Types: Cigarettes    Smokeless tobacco: Never   Substance Use Topics    Alcohol use: Yes     Alcohol/week: 0.0 standard drinks of alcohol     Comment: occas 1x/ wk    Drug use: No             Review of Systems    Physical Exam     Triage Vitals  Triage Start: Start, (08/08/22 1345)  First Recorded BP: 118/84, Resp: 18, Temp: 36.5 C (97.7 F), Temp src: Oral Oxygen Therapy SpO2: 98 %, Oximetry Source: Rt Hand, O2 Device: None (Room air), Heart Rate: 91, (08/08/22 1349)  .  First Pain Reported  0-10 Scale: 3, Pain Location/Orientation: Ear Left, (08/08/22 1346)       Physical Exam  Vitals and nursing note reviewed.   Constitutional:       Appearance: She is well-developed.   HENT:      Head: Normocephalic and atraumatic.      Right Ear: Tympanic membrane normal.      Left Ear: Tympanic membrane normal.   Eyes:      Conjunctiva/sclera: Conjunctivae normal.      Pupils: Pupils are equal, round, and reactive to light.   Cardiovascular:      Rate and Rhythm: Normal rate and regular rhythm.       Heart sounds: Normal heart sounds.   Pulmonary:      Effort: Pulmonary effort is normal.      Breath sounds: Normal breath sounds.   Abdominal:      General: Bowel sounds are normal.      Palpations: Abdomen is soft.   Musculoskeletal:         General: Normal range of motion.      Cervical back: Normal range of motion and neck supple.   Skin:     General: Skin is warm and dry.   Neurological:      Mental Status: She is alert and oriented to person, place, and time.   Psychiatric:  Mood and Affect: Mood normal.         Medical Decision Making   Patient seen by me on:  08/08/2022    Assessment:  37 yr old female intermittent left ear pain    Differential diagnosis:  [place] Peers, sinusitis,otitis media, otitis externa    Plan:  Exam    ED Course and Disposition:  Benign exam  Given return precautions              Verlin Grills, MD            Verlin Grills, MD  08/10/22 1751

## 2023-06-07 LAB — UNMAPPED LAB RESULTS
Hematocrit (HT): 51 % — ABNORMAL HIGH (ref 34–47)
Hemoglobin (HGB) (HT): 16.7 g/dL — ABNORMAL HIGH (ref 11.5–16.0)
MCHC (HT): 33.1 g/dL (ref 32.0–36.0)
MCV (HT): 85.3 fL (ref 81.0–99.0)
Mean Corpuscular Hemoglobin (MCH) (HT): 28.2 pg (ref 26.0–34.0)
Platelets (HT): 198 10 3/uL (ref 150–450)
RBC (HT): 5.92 10 6/uL — ABNORMAL HIGH (ref 3.80–5.20)
RDW (HT): 13.6 % (ref 11.5–15.0)
WBC (HT): 11.1 10 3/uL — ABNORMAL HIGH (ref 4.0–10.8)

## 2023-09-11 ENCOUNTER — Encounter: Payer: Self-pay | Admitting: Emergency Medicine

## 2023-09-19 ENCOUNTER — Other Ambulatory Visit: Payer: Self-pay

## 2023-09-19 ENCOUNTER — Ambulatory Visit

## 2023-09-19 VITALS — BP 122/82 | Ht 69.0 in | Wt 222.0 lb

## 2023-09-19 DIAGNOSIS — Z124 Encounter for screening for malignant neoplasm of cervix: Secondary | ICD-10-CM | POA: Insufficient documentation

## 2023-09-19 DIAGNOSIS — Z113 Encounter for screening for infections with a predominantly sexual mode of transmission: Secondary | ICD-10-CM | POA: Insufficient documentation

## 2023-09-19 DIAGNOSIS — Z01419 Encounter for gynecological examination (general) (routine) without abnormal findings: Secondary | ICD-10-CM | POA: Insufficient documentation

## 2023-09-19 DIAGNOSIS — L732 Hidradenitis suppurativa: Secondary | ICD-10-CM | POA: Insufficient documentation

## 2023-09-19 LAB — N. GONORRHOEAE NAAT (PCR): N. gonorrhoeae NAAT (PCR): NEGATIVE

## 2023-09-19 LAB — CHLAMYDIA NAAT (PCR): Chlamydia NAAT (PCR): NEGATIVE

## 2023-09-19 LAB — TRICHOMONAS NAAT: Trichomonas NAAT: NEGATIVE

## 2023-09-19 MED ORDER — CLINDAMYCIN PHOSPHATE 1 % EX SOLN *I*
CUTANEOUS | 0 refills | Status: AC
Start: 2023-09-19 — End: ?

## 2023-09-19 NOTE — H&P (Signed)
 [facility]    Chief Complaint:     Gynecologic Exam    Subjective:     Amy Osborn is a 38 y.o. female [phone] with LMP of   who presents for an annual exam.     She is currently using Liletta IUD for contraception inserted 03/2019. Irregular spotting every couple of months. Denies dysmenorrhea.     She is not currently sexually active.  She reports no problems with intercourse. She is not planning on getting pregnant in the next year. STI screening is requested.  Patient notes no breast concerns today. Other gyn related symptoms noted today: has experienced large boils near her pubic bone - they are very tender. States that they pop and resolve and leave behind deep skin markings. Reports getting a flare once per month.    Since her last annual gyn exam she notes no significant recent changes in her general health.    Denies fhx breast, uterine, ovarian, colorectal cancer    PSH: LEEP 2014    PAP:   (10/23/17) PAP ASCUS + HPV 16, needs colpo   Colpo (in pregnancy) -11/08/17  (02/25/2019) NILM/Neg - cotesting in 3 yrs      MVI - not currently  Exercise - not currently  Denies safety concerns     Part-time work as a Teacher, English as a foreign language    Kids are now 18 and 5!     Patient Active Problem List   Diagnosis Code    Nicotine  dependence F17.200    Umbilical discharge R19.8    Pink eye H10.029    Conjunctivitis H10.9    SVD (spontaneous vaginal delivery) O80    Anxiety F41.9    Bilateral polycystic ovarian syndrome E28.2     Past Medical History:   Diagnosis Date    Anxiety     Complication of anesthesia     n/v after anesthetics per pt    Kidney stones     Kidney stones     Mood disorder     Nephrolithiasis      Past Surgical History:   Procedure Laterality Date    LEEP      LITHOTRIPSY       Family History   Problem Relation Age of Onset    Anxiety disorder Mother     No Known Problems Father      Social History[1]        Objective:     BP 122/82    Ht 1.753 m (5' 9)   Wt 100.7 kg (222 lb)    BMI 32.78 kg/m           Physical Exam   Constitutional: She appears well-developed and well-nourished. No distress.   HENT:   Head: Normocephalic.   Cardiovascular: Normal heart sounds.   Pulmonary/Chest: Effort normal and breath sounds normal.   Breast:       Inspection: no asymmetry, skin changes or nipple retraction      Palpation: no mass or tenderness      Axillary/Clavicular lymph nodes: no lymphadenopathy present   Abdominal: Soft. There is no abdominal tenderness.   Genitourinary:      External genitalia: normal appearance for age, no lesion(s)  Scarring from HS outbreak appreciated across the mons pubis     Bladder: no tenderness       Vagina:  no mass(es)       Cervix: normal in appearance  no lesion(s) or tenderness  IUD strings visualized 2-3  cm from os     Uterus:   no tenderness and normal contour   Sub-optimal exam secondary to body habitus but no gross masses appreciated or tenderness to palpation       Adnexa: no tenderness or mass(es)   Structures not palpable due to body habitus but no gross masses appreciated or tenderness to palpation       Perineum: normal in appearance   Psychiatric: She has a normal mood and affect.          Wet Prep:     Assessment:     Amy Osborn is a 38 y.o. female who presents for an annual exam.       Plan:      Health/Risk Assement:     - Reviewed healthy lifestyle choices  Hidradenitis Suppurativa  - Discussed likely diagnosis of hidradenitis suppurativa, given symptoms, exam findings and clinical course. Explained pathophysiology of this condition and discussed first-line prevention with topical antibiotic gel (clindamycin). Rx sent to pharmacy - advised patient to apply to the area (axilla and groin) twice daily. Recommended dermatology.   Sexuality and Reproductive Planning:    - Current birth control method: Liletta IUD  - Patient is satisfied with current contraceptive use.   - IUD inserted 03/2019 due for removal by 03/2026.  Fitness and Nutrition:    - Physical  activity: Patient counseled on importance of regular exercise.  - Supplements: Discussed the role of multivitamin or prenatal vitamins for adequate folic acid intake. Reviewed the importance of calcium /vitamin D and how to obtain adequate amounts through diet and/or supplementation.  Labs and other tests:    - Reviewed current Pap/HPV screening guidelines  - Last Pap 03/04/2019 with results negative cytology and HPV negative  - Pap/HPV testing done today  - STI screening tests done   - Patient offered comprehensive STI testing. Patient agrees to testing for HIV, Syphilis, Hep B, and Hep C.  Return: Patient to return for AGY and/or as needed.     Mee Mode, NP           [1]   Social History  Socioeconomic History    Marital status: Single   Tobacco Use    Smoking status: Every Day     Packs/day: 0.50     Years: 10.00     Additional pack years: 0.00     Total pack years: 5.00     Types: Cigarettes    Smokeless tobacco: Never   Substance and Sexual Activity    Alcohol use: Yes     Alcohol/week: 0.0 standard drinks of alcohol     Comment: occas 1x/ wk    Drug use: No    Sexual activity: Yes     Partners: Male     Birth control/protection: I.U.D.

## 2023-09-20 ENCOUNTER — Ambulatory Visit: Payer: Self-pay

## 2023-09-20 LAB — HPV DNA PROBE WITH CYTOLOGY
HPV Other High Risk: POSITIVE — AB
HPV Type 16: NEGATIVE
HPV Type 18: NEGATIVE

## 2023-09-28 LAB — GYN CYTOLOGY

## 2023-10-03 NOTE — Telephone Encounter (Signed)
 Call to pt. Informed her of pap results and recommendation to do colpo. Pt is agreeable. Will have secretaries call her to schedule. Mariel Seamen, RN

## 2023-10-20 ENCOUNTER — Other Ambulatory Visit: Payer: Self-pay

## 2023-10-20 ENCOUNTER — Ambulatory Visit: Attending: Pathology | Admitting: Obstetrics and Gynecology

## 2023-10-20 VITALS — BP 116/84 | Ht 69.0 in | Wt 219.0 lb

## 2023-10-20 DIAGNOSIS — Z0189 Encounter for other specified special examinations: Secondary | ICD-10-CM | POA: Insufficient documentation

## 2023-10-20 DIAGNOSIS — R87810 Cervical high risk human papillomavirus (HPV) DNA test positive: Secondary | ICD-10-CM

## 2023-10-20 DIAGNOSIS — B977 Papillomavirus as the cause of diseases classified elsewhere: Secondary | ICD-10-CM | POA: Insufficient documentation

## 2023-10-20 LAB — POCT URINE PREGNANCY
Lot #: 896420
Preg Test,UR POC: NEGATIVE

## 2023-10-20 NOTE — Procedures (Signed)
 OB/GYN General Procedures    Date/Time: 10/20/2023  9:30 AM EDT    Performed by: Alfred Therisa CROME, DO  Amy Osborn is a 38 y.o. [id number] who presents for procedure today.    Urine pregnancy test No results found for requested lab(s) within last 72 hours.    Consent:   Consent obtained:  Written   Consent given by: patient  The procedure risks, benefits, complications and possible alternatives were discussed with the patient.  All questions were answered prior to the patient giving informed consent    Pre-procedure:  Chaperone present:  Yes Chaperone Name:  Georgina Carrier    Procedure Details:    Indication: negative cytology and HPV positive pap in June 2025    Dysplasia History:  PAP ASCUS + HPV 16, needs colpo (10/23/17)  Colpo-11/08/17  02/25/2019: pap/hpv negative - cotesting in 3 yrs  adacel given 03/14/18  09/19/23 - NILM/+hrHPV - needs repeat colpo    Liletta IUD inserted 04/02/19    Smoker        Packs/day: 0.50        Years: 10.00        Additional pack years: 0.00        Total pack years: 5.00        Types: Cigarettes      Smokeless tobacco: Never      The patient was placed in the dorsal lithotomy position. The vulva and vagina were examined with findings of no gross abnormalities.  A speculum was placed and the cervix was visualized. Acetic acid was applied to the cervix.     The following findings were noted:  Cervix: fully visualized      Squamocolumnar junction: fully visualized  Acetowhite changes: no  Lesion(s) present (acetowhite or other): no  Benign findings: IUD strings visualized    Lesion Description:  None    Impression: Normal/Benign      Endocervical curettage performed: yes  Cervical biopsies taken: yes, at 10 o'clock  Random biopsies done: yes - 1 biopsies taken (as above)  Other biopsies taken: no  Hemostasis: Monsel's solution        Patient Patient tolerated procedure well.    Estimated blood loss: <5 cc     Specimens to Pathology: cervical biops(ies)  and endocervical curettage    Plan:  -  Post colposcopy instructions were reviewed with the patient including:   - Warning signs and symptoms of infection and abnormal bleeding   - Instructed to avoid coitus, douching and tampons for 12-3 days.  - Post procedure instructions handout given.  - Questions were answered and the patient states good understanding of instructions.   - Patient prefers results and further plan to be reviewed via MyChart.     Therisa Alfred, DO  [facility]

## 2023-11-01 LAB — SURGICAL PATHOLOGY

## 2023-11-02 ENCOUNTER — Ambulatory Visit: Payer: Self-pay | Admitting: Obstetrics and Gynecology

## 2024-01-05 ENCOUNTER — Encounter: Payer: Self-pay | Admitting: Internal Medicine
